# Patient Record
Sex: Male | Born: 1950 | Race: White | Hispanic: No | Marital: Married | Smoking: Current every day smoker
Health system: Southern US, Community
[De-identification: ages and names within clinical notes are randomized; demographics above are authoritative.]

## PROBLEM LIST (undated history)

## (undated) DIAGNOSIS — I2699 Other pulmonary embolism without acute cor pulmonale: Secondary | ICD-10-CM

## (undated) DIAGNOSIS — Z7901 Long term (current) use of anticoagulants: Secondary | ICD-10-CM

## (undated) DIAGNOSIS — Z95828 Presence of other vascular implants and grafts: Secondary | ICD-10-CM

## (undated) DIAGNOSIS — J40 Bronchitis, not specified as acute or chronic: Secondary | ICD-10-CM

## (undated) DIAGNOSIS — I82409 Acute embolism and thrombosis of unspecified deep veins of unspecified lower extremity: Secondary | ICD-10-CM

---

## 2004-09-03 ENCOUNTER — Encounter: Admission: RE | Admit: 2004-09-03 | Discharge: 2004-09-03 | Payer: Self-pay | Admitting: *Deleted

## 2004-09-09 ENCOUNTER — Encounter: Admission: RE | Admit: 2004-09-09 | Discharge: 2004-09-09 | Payer: Self-pay | Admitting: *Deleted

## 2011-01-11 DIAGNOSIS — Z7901 Long term (current) use of anticoagulants: Secondary | ICD-10-CM

## 2011-01-11 DIAGNOSIS — I2699 Other pulmonary embolism without acute cor pulmonale: Secondary | ICD-10-CM

## 2011-01-11 HISTORY — DX: Long term (current) use of anticoagulants: Z79.01

## 2011-01-11 HISTORY — DX: Other pulmonary embolism without acute cor pulmonale: I26.99

## 2011-02-03 ENCOUNTER — Emergency Department (HOSPITAL_COMMUNITY): Payer: BC Managed Care – HMO

## 2011-02-03 ENCOUNTER — Inpatient Hospital Stay (HOSPITAL_COMMUNITY)
Admission: EM | Admit: 2011-02-03 | Discharge: 2011-02-08 | DRG: 539 | Disposition: A | Discharge status: Home / Self Care | Payer: BC Managed Care – HMO | Source: Ambulatory Visit | Attending: Internal Medicine | Admitting: Internal Medicine

## 2011-02-03 ENCOUNTER — Encounter: Payer: Self-pay | Admitting: *Deleted

## 2011-02-03 ENCOUNTER — Other Ambulatory Visit: Payer: Self-pay

## 2011-02-03 DIAGNOSIS — F17200 Nicotine dependence, unspecified, uncomplicated: Secondary | ICD-10-CM | POA: Diagnosis present

## 2011-02-03 DIAGNOSIS — I2699 Other pulmonary embolism without acute cor pulmonale: Principal | ICD-10-CM | POA: Diagnosis present

## 2011-02-03 DIAGNOSIS — I498 Other specified cardiac arrhythmias: Secondary | ICD-10-CM | POA: Diagnosis present

## 2011-02-03 DIAGNOSIS — Z720 Tobacco use: Secondary | ICD-10-CM | POA: Diagnosis present

## 2011-02-03 DIAGNOSIS — I824Y9 Acute embolism and thrombosis of unspecified deep veins of unspecified proximal lower extremity: Secondary | ICD-10-CM | POA: Diagnosis present

## 2011-02-03 DIAGNOSIS — I2789 Other specified pulmonary heart diseases: Secondary | ICD-10-CM | POA: Diagnosis present

## 2011-02-03 DIAGNOSIS — J40 Bronchitis, not specified as acute or chronic: Secondary | ICD-10-CM | POA: Diagnosis present

## 2011-02-03 DIAGNOSIS — I825Y9 Chronic embolism and thrombosis of unspecified deep veins of unspecified proximal lower extremity: Secondary | ICD-10-CM | POA: Diagnosis present

## 2011-02-03 DIAGNOSIS — R0602 Shortness of breath: Secondary | ICD-10-CM

## 2011-02-03 DIAGNOSIS — R7989 Other specified abnormal findings of blood chemistry: Secondary | ICD-10-CM

## 2011-02-03 HISTORY — DX: Acute embolism and thrombosis of unspecified deep veins of unspecified lower extremity: I82.409

## 2011-02-03 HISTORY — DX: Bronchitis, not specified as acute or chronic: J40

## 2011-02-03 HISTORY — DX: Other pulmonary embolism without acute cor pulmonale: I26.99

## 2011-02-03 HISTORY — DX: Long term (current) use of anticoagulants: Z79.01

## 2011-02-03 HISTORY — DX: Presence of other vascular implants and grafts: Z95.828

## 2011-02-03 LAB — CBC
Hemoglobin: 17.9 g/dL — ABNORMAL HIGH (ref 13.0–17.0)
MCH: 30.9 pg (ref 26.0–34.0)
MCHC: 35.2 g/dL (ref 30.0–36.0)
MCV: 87.6 fL (ref 78.0–100.0)
Platelets: 242 10*3/uL (ref 150–400)
RDW: 12.6 % (ref 11.5–15.5)
WBC: 11.8 10*3/uL — ABNORMAL HIGH (ref 4.0–10.5)

## 2011-02-03 LAB — POCT I-STAT TROPONIN I: Troponin i, poc: 0.39 ng/mL (ref 0.00–0.08)

## 2011-02-03 LAB — BASIC METABOLIC PANEL
Creatinine, Ser: 1.12 mg/dL (ref 0.50–1.35)
GFR calc Af Amer: 80 mL/min — ABNORMAL LOW (ref >90)
Glucose, Bld: 113 mg/dL — ABNORMAL HIGH (ref 70–99)

## 2011-02-03 LAB — D-DIMER, QUANTITATIVE: D-Dimer, Quant: 5.1 ug/mL-FEU — ABNORMAL HIGH (ref 0.00–0.48)

## 2011-02-03 MED ORDER — POLYETHYLENE GLYCOL 3350 17 G PO PACK
17.0000 g | PACK | Freq: Every day | ORAL | Status: DC | PRN
  Filled 2011-02-03: qty 1

## 2011-02-03 MED ORDER — ACETAMINOPHEN 650 MG RE SUPP: 650.0000 mg | Freq: Four times a day (QID) | RECTAL | Status: DC | PRN

## 2011-02-03 MED ORDER — OXYCODONE HCL 5 MG PO TABS: 5.0000 mg | ORAL_TABLET | ORAL | Status: DC | PRN

## 2011-02-03 MED ORDER — ONDANSETRON HCL 4 MG PO TABS: 4.0000 mg | ORAL_TABLET | Freq: Four times a day (QID) | ORAL | Status: DC | PRN

## 2011-02-03 MED ORDER — IOHEXOL 350 MG/ML SOLN
100.0000 mL | Freq: Once | INTRAVENOUS | Status: AC | PRN
  Administered 2011-02-03: 100 mL via INTRAVENOUS

## 2011-02-03 MED ORDER — HEPARIN (PORCINE) IN NACL 100-0.45 UNIT/ML-% IJ SOLN
2600.0000 [IU]/h | INTRAMUSCULAR | Status: DC
  Administered 2011-02-03: 1150 [IU]/h via INTRAVENOUS
  Administered 2011-02-04 (×2): 1500 [IU]/h via INTRAVENOUS
  Administered 2011-02-05 (×2): 2250 [IU]/h via INTRAVENOUS
  Administered 2011-02-05: 2000 [IU]/h via INTRAVENOUS
  Administered 2011-02-05: 2250 [IU]/h via INTRAVENOUS
  Administered 2011-02-06: 2600 [IU]/h via INTRAVENOUS
  Filled 2011-02-03 (×10): qty 250

## 2011-02-03 MED ORDER — HEPARIN BOLUS VIA INFUSION
4000.0000 [IU] | Freq: Once | INTRAVENOUS | Status: AC
  Administered 2011-02-03: 4000 [IU] via INTRAVENOUS
  Filled 2011-02-03: qty 4000

## 2011-02-03 MED ORDER — ONDANSETRON HCL 4 MG/2ML IJ SOLN: 4.0000 mg | Freq: Four times a day (QID) | INTRAMUSCULAR | Status: DC | PRN

## 2011-02-03 MED ORDER — ACETAMINOPHEN 325 MG PO TABS: 650.0000 mg | ORAL_TABLET | Freq: Four times a day (QID) | ORAL | Status: DC | PRN

## 2011-02-03 MED ORDER — DOCUSATE SODIUM 100 MG PO CAPS
100.0000 mg | ORAL_CAPSULE | Freq: Two times a day (BID) | ORAL | Status: DC
  Administered 2011-02-03 – 2011-02-07 (×2): 100 mg via ORAL
  Filled 2011-02-03 (×11): qty 1
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
## 2011-02-03 NOTE — ED Provider Notes (Signed)
History     CSN: [id number] Arrival date & time: 02/03/2011  1:16 PM   First MD Initiated Contact with Patient 02/03/11 1327      Chief Complaint  Patient presents with  . Chest Pain    (Consider location/radiation/quality/duration/timing/severity/associated sxs/prior treatment) HPI Comments: Patient with no previous cardiac history, h/o DVT in left leg s/p anticoagulation x 2 years -- presents with 7 days of bronchitis-like symptoms. However the patient states that for the past 4 days he has been very short of breath with any type of exertion which is unusual for him. The patient states that this morning he noted chest pain described as a substernal chest pressure with walking up stairs and walking outside that relieved with rest. He took 325 mg of aspirin this morning. The pain did not radiate or was not associated with diaphoresis or nausea. The patient is a smoker and has a strong family history of CAD including stent placement in a younger brother and a father who had his first heart attack at age 79. No previous formal stress testing done.  Patient is a 60 y.o. male presenting with chest pain. The history is provided by the patient.  Chest Pain The chest pain began 3 - 5 hours ago. Chest pain occurs intermittently. The chest pain is resolved. The pain is associated with exertion. The quality of the pain is described as pressure-like. The pain does not radiate. Primary symptoms include fatigue and shortness of breath. Pertinent negatives for primary symptoms include no fever, no syncope, no cough, no wheezing, no palpitations, no abdominal pain, no nausea, no vomiting and no dizziness.  Pertinent negatives for associated symptoms include no diaphoresis.  His past medical history is significant for DVT.  Pertinent negatives for past medical history include no MI and no PE.  His family medical history is significant for CAD in family and early MI in family.  Procedure history is negative  for cardiac catheterization.     Past Medical History  Diagnosis Date  . DVT (deep venous thrombosis)   . Bronchitis     History reviewed. No pertinent past surgical history.  History reviewed. No pertinent family history.  History  Substance Use Topics  . Smoking status: Former Games developer  . Smokeless tobacco: Not on file  . Alcohol Use: No      Review of Systems  Constitutional: Positive for fatigue. Negative for fever and diaphoresis.  HENT: Negative for neck pain.   Eyes: Negative for redness.  Respiratory: Positive for shortness of breath. Negative for cough and wheezing.   Cardiovascular: Positive for chest pain. Negative for palpitations and syncope.  Gastrointestinal: Negative for nausea, vomiting and abdominal pain.  Genitourinary: Negative for dysuria.  Musculoskeletal: Negative for back pain.  Skin: Negative for rash.  Neurological: Negative for dizziness, syncope and light-headedness.    Allergies  Review of patient's allergies indicates not on file.  Home Medications  No current outpatient prescriptions on file.  BP 141/98  Pulse 104  Temp(Src) 97 F (36.1 C) (Oral)  Resp 20  SpO2 96%  Physical Exam  Nursing note and vitals reviewed. Constitutional: He is oriented to person, place, and time. He appears well-developed and well-nourished.  HENT:  Head: Normocephalic and atraumatic.  Eyes: Conjunctivae are normal. Pupils are equal, round, and reactive to light.  Neck: Normal range of motion. Neck supple. No JVD present.  Cardiovascular: Normal rate, regular rhythm, normal heart sounds and intact distal pulses.  Exam reveals no gallop and no  friction rub.   No murmur heard. Pulmonary/Chest: Effort normal and breath sounds normal. He has no wheezes. He has no rales. He exhibits no tenderness.  Abdominal: Soft. Bowel sounds are normal. There is no tenderness. There is no rebound and no guarding.  Musculoskeletal: He exhibits no edema.  Neurological: He  is alert and oriented to person, place, and time.  Skin: Skin is warm and dry. He is not diaphoretic.  Psychiatric: He has a normal mood and affect.    ED Course  Procedures (including critical care time)  Labs Reviewed  BASIC METABOLIC PANEL - Abnormal; Notable for the following:    Glucose, Bld 113 (*)    GFR calc non Af Amer 69 (*)    GFR calc Af Amer 80 (*)    All other components within normal limits  CBC - Abnormal; Notable for the following:    WBC 11.8 (*)    Hemoglobin 17.9 (*)    All other components within normal limits  D-DIMER, QUANTITATIVE - Abnormal; Notable for the following:    D-Dimer, Quant 5.10 (*)    All other components within normal limits  POCT I-STAT TROPONIN I - Abnormal; Notable for the following:    Troponin i, poc 0.39 (*)    All other components within normal limits  I-STAT TROPONIN I   Dg Chest 2 View  02/03/2011  *RADIOLOGY REPORT*  Clinical Data: Chest pain and shortness of breath for 1 week. Bronchitis.  CHEST - 2 VIEW  Comparison: None.  Findings: Midline trachea.  Normal heart size and mediastinal contours. No pleural effusion or pneumothorax.  Mild reticular nodular/interstitial opacity.  Slightly upper lobe predominant.  IMPRESSION: 1.  No acute cardiopulmonary disease. 2. Slight upper lobe predominant reticular nodular opacity. Favored to represent the sequelae of chronic bronchitis/smoking. In the acute setting, atypical bacterial or viral pneumonia could look similar.  Original Report Authenticated By: Consuello Bossier, M.D.     1. Chest pain     1:42 PM Patient seen and examined.    Date: 02/03/2011  Rate: 101  Rhythm: sinus tachycardia  QRS Axis: normal  Intervals: normal  ST/T Wave abnormalities: nonspecific ST changes  Conduction Disutrbances:none  Narrative Interpretation:   Old EKG Reviewed: none available  3:10 PM X-ray reviewed by radiologist and by myself.  4:34 PM troponin was elevated. Heparin ordered. I spoke with Dr.  Verdis Prime of [facility] cardiology who will see patient in the emergency department. Will hold on chest CT for now.  5:28 PM CT showed bilateral PE, cards has seen. Will admit to medicine.   5:42 PM Teaching service to see and admit. Dr. Rogelia Boga is admitting attending.   MDM  Suspicion for ACS. Also will need to r/o PE.         Carolee Rota, PA 02/03/11 1636  Carolee Rota, PA 02/03/11 1729  Carolee Rota, PA 02/03/11 [phone]

## 2011-02-03 NOTE — ED Notes (Signed)
Patient transported to CT 

## 2011-02-03 NOTE — ED Notes (Signed)
[place], PA notified of abnormal lab test results
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
## 2011-02-03 NOTE — ED Notes (Signed)
Patient transported to X-ray 

## 2011-02-03 NOTE — Progress Notes (Signed)
60 year old male started having exertional dyspnea yesterday and had exertional chest pain with dyspnea today. He had recently completed a course of Avelox for bronchitis, but he was not having any chest tightness or chest pain with his bronchitis. He does not have a previous known history of coronary artery disease. His ECG is nondiagnostic, but his cardiac markers have come back positive. He will need to be admitted to the cardiology service.

## 2011-02-03 NOTE — ED Notes (Signed)
Gave new ECG to Dr. Carter Kitten and Dr. Preston Fleeting after I performed. 1:26 pm JG

## 2011-02-03 NOTE — ED Notes (Signed)
7 days ago. Developed bronchitis. Taking avelox. Sob with walking. When walking outside developed central cp. Resolved with deep breaths. When walking upstairs became sob. No diaphoresis, no n/v. Took 325 mg. Asa this morning.

## 2011-02-03 NOTE — Progress Notes (Signed)
ANTICOAGULATION CONSULT NOTE - Initial Consult  Pharmacy Consult for Heparin Indication: chest pain/ACS  No Known Allergies  Patient Measurements: Height: 5\' 6"  (167.6 cm) Weight: 185 lb (83.915 kg) IBW/kg (Calculated) : 63.8  Adjusted Body Weight: 81kg  Vital Signs: Temp: 97 F (36.1 C) (11/24 1319) Temp src: Oral (11/24 1319) BP: 132/93 mmHg (11/24 1526) Pulse Rate: 101  (11/24 1526)  Labs:  Basename 02/03/11 1419  HGB 17.9*  HCT 50.8  PLT 242  APTT --  LABPROT --  INR --  HEPARINUNFRC --  CREATININE 1.12  CKTOTAL --  CKMB --  TROPONINI --   Estimated Creatinine Clearance: 70.3 ml/min (by C-G formula based on Cr of 1.12).  Medical History: Past Medical History  Diagnosis Date  . DVT (deep venous thrombosis)   . Bronchitis     Medications:  Pending  Assessment: 60 y/o male being treated with abx for bronchitis developed CP with SOB with + cardiac enzymes. Start IV heparin.   Goal of Therapy:  Heparin level 0.3-0.7 units/ml   Plan:  Heparin 4000 unit bolus, infusion 1150 units/hr. Check heparin level in 6-8 hrs and daily.  Misty Stanley Stillinger 02/03/2011,4:46 PM

## 2011-02-03 NOTE — Progress Notes (Signed)
Received patient as a transfer from 3700. VSS. Denies any pain or SOB.

## 2011-02-03 NOTE — Progress Notes (Signed)
CT angio positive for bilateral PEs. Discussed with EDP - medicine will admit. We will obtain 2D echo to eval for R heart strain. Will follow with you.  Dayna Dunn PA-C 02/03/2011, 5:15 PM

## 2011-02-04 DIAGNOSIS — J40 Bronchitis, not specified as acute or chronic: Secondary | ICD-10-CM | POA: Diagnosis present

## 2011-02-04 DIAGNOSIS — Z720 Tobacco use: Secondary | ICD-10-CM | POA: Diagnosis present

## 2011-02-04 LAB — CARDIAC PANEL(CRET KIN+CKTOT+MB+TROPI)
CK, MB: 3.4 ng/mL (ref 0.3–4.0)
CK, MB: 2.8 ng/mL (ref 0.3–4.0)
Relative Index: 3.3 — ABNORMAL HIGH (ref 0.0–2.5)
Relative Index: 3.4 — ABNORMAL HIGH (ref 0.0–2.5)
Relative Index: INVALID (ref 0.0–2.5)
Total CK: 120 U/L (ref 7–232)
Total CK: 100 U/L (ref 7–232)
Total CK: 90 U/L (ref 7–232)
Troponin I: 0.42 ng/mL (ref <0.30)
Troponin I: <0.3 ng/mL (ref <0.30)
Troponin I: <0.3 ng/mL (ref <0.30)

## 2011-02-04 LAB — ANTITHROMBIN III: AntiThromb III Func: 92 % (ref 75–120)

## 2011-02-04 LAB — BASIC METABOLIC PANEL
Calcium: 8.7 mg/dL (ref 8.4–10.5)
Creatinine, Ser: 1.16 mg/dL (ref 0.50–1.35)
GFR calc Af Amer: 77 mL/min — ABNORMAL LOW (ref >90)
Sodium: 139 mEq/L (ref 135–145)

## 2011-02-04 LAB — PROTIME-INR
INR: 0.99 (ref 0.00–1.49)
Prothrombin Time: 13.3 seconds (ref 11.6–15.2)

## 2011-02-04 LAB — HOMOCYSTEINE: Homocysteine: 12.1 umol/L (ref 4.0–15.4)

## 2011-02-04 LAB — CBC
MCHC: 35.2 g/dL (ref 30.0–36.0)
RBC: 5.29 MIL/uL (ref 4.22–5.81)
WBC: 12.1 10*3/uL — ABNORMAL HIGH (ref 4.0–10.5)

## 2011-02-04 MED ORDER — IBUPROFEN 600 MG PO TABS
600.0000 mg | ORAL_TABLET | Freq: Four times a day (QID) | ORAL | Status: DC | PRN
  Filled 2011-02-04: qty 1

## 2011-02-04 MED ORDER — WARFARIN VIDEO
Freq: Once | Status: DC
  Filled 2011-02-04: qty 1

## 2011-02-04 MED ORDER — SODIUM CHLORIDE 0.9 % IV SOLN
INTRAVENOUS | Status: DC
  Administered 2011-02-04 (×2): via INTRAVENOUS

## 2011-02-04 MED ORDER — HEPARIN BOLUS VIA INFUSION
5000.0000 [IU] | Freq: Once | INTRAVENOUS | Status: AC
  Administered 2011-02-04: 5000 [IU] via INTRAVENOUS
  Filled 2011-02-04: qty 5000

## 2011-02-04 MED ORDER — HEPARIN BOLUS VIA INFUSION
3000.0000 [IU] | Freq: Once | INTRAVENOUS | Status: AC
  Administered 2011-02-04: 3000 [IU] via INTRAVENOUS
  Filled 2011-02-04: qty 3000

## 2011-02-04 MED ORDER — PATIENT'S GUIDE TO USING COUMADIN BOOK
Freq: Once | Status: AC
  Administered 2011-02-04: 18:00:00
  Filled 2011-02-04: qty 1

## 2011-02-04 MED ORDER — WARFARIN SODIUM 10 MG PO TABS
10.0000 mg | ORAL_TABLET | Freq: Once | ORAL | Status: AC
  Administered 2011-02-04: 10 mg via ORAL
  Filled 2011-02-04: qty 1

## 2011-02-04 NOTE — H&P (Signed)
Internal Medicine Teaching Service Attending Note Date: 02/04/2011  Patient name: Austin Garza  Medical record number: [id number]  Date of birth: [birth year]   I have seen and evaluated Austin Garza and discussed their care with the Residency Team.   Patient is comfortable lying in bed but with any exertion is SOB and desaturates to 02 sats in the 80's. He does not seem to want to admit the severity of his illness or the importance of getting a PCP or taking better care of himself.  Physical Exam: Blood pressure 124/67, pulse 71, temperature 98.4 F (36.9 C), temperature source Oral, resp. rate 17, height 5' 6.5" (1.689 m), weight 188 lb 11.4 oz (85.6 kg), SpO2 96.00%. Sitting up in bed in NAD,  Neck-JVD, lungs- clear to my exam, heart-RRR, abdomen-+BS, NT, extrem-no edema, neuro-non-focal.  Lab results: Results for orders placed during the hospital encounter of 02/03/11 (from the past 24 hour(s))  BASIC METABOLIC PANEL     Status: Abnormal   Collection Time   02/03/11  2:19 PM      Component Value Range   Sodium 137  135 - 145 (mEq/L)   Potassium 4.1  3.5 - 5.1 (mEq/L)   Chloride 102  96 - 112 (mEq/L)   CO2 24  19 - 32 (mEq/L)   Glucose, Bld 113 (*) 70 - 99 (mg/dL)   BUN 10  6 - 23 (mg/dL)   Creatinine, Ser 4.09  0.50 - 1.35 (mg/dL)   Calcium 9.4  8.4 - 81.1 (mg/dL)   GFR calc non Af Amer 69 (*) >90 (mL/min)   GFR calc Af Amer 80 (*) >90 (mL/min)  CBC     Status: Abnormal   Collection Time   02/03/11  2:19 PM      Component Value Range   WBC 11.8 (*) 4.0 - 10.5 (K/uL)   RBC 5.80  4.22 - 5.81 (MIL/uL)   Hemoglobin 17.9 (*) 13.0 - 17.0 (g/dL)   HCT 91.4  78.2 - 95.6 (%)   MCV 87.6  78.0 - 100.0 (fL)   MCH 30.9  26.0 - 34.0 (pg)   MCHC 35.2  30.0 - 36.0 (g/dL)   RDW 21.3  08.6 - 57.8 (%)   Platelets 242  150 - 400 (K/uL)  D-DIMER, QUANTITATIVE     Status: Abnormal   Collection Time   02/03/11  2:19 PM      Component Value Range   D-Dimer, Quant 5.10 (*) 0.00 - 0.48  (ug/mL-FEU)  POCT I-STAT TROPONIN I     Status: Abnormal   Collection Time   02/03/11  4:04 PM      Component Value Range   Troponin i, poc 0.39 (*) 0.00 - 0.08 (ng/mL)   Comment NOTIFIED PHYSICIAN     Comment 3           MRSA PCR SCREENING     Status: Normal   Collection Time   02/03/11  8:54 PM      Component Value Range   MRSA by PCR NEGATIVE  NEGATIVE   CARDIAC PANEL(CRET KIN+CKTOT+MB+TROPI)     Status: Abnormal   Collection Time   02/04/11 12:01 AM      Component Value Range   Total CK 120  7 - 232 (U/L)   CK, MB 3.9  0.3 - 4.0 (ng/mL)   Troponin I 0.42 (*) <0.30 (ng/mL)   Relative Index 3.3 (*) 0.0 - 2.5   HEPARIN LEVEL (UNFRACTIONATED)     Status: Abnormal  Collection Time   02/04/11 12:23 AM      Component Value Range   Heparin Unfractionated <0.10 (*) 0.30 - 0.70 (IU/mL)  ANTITHROMBIN III     Status: Normal   Collection Time   02/04/11 12:23 AM      Component Value Range   AntiThromb III Func 92  75 - 120 (%)  PRO B NATRIURETIC PEPTIDE     Status: Abnormal   Collection Time   02/04/11 12:45 AM      Component Value Range   BNP, POC 723.3 (*) 0 - 125 (pg/mL)  CBC     Status: Abnormal   Collection Time   02/04/11  5:00 AM      Component Value Range   WBC 12.1 (*) 4.0 - 10.5 (K/uL)   RBC 5.29  4.22 - 5.81 (MIL/uL)   Hemoglobin 16.4  13.0 - 17.0 (g/dL)   HCT 16.1  09.6 - 04.5 (%)   MCV 88.1  78.0 - 100.0 (fL)   MCH 31.0  26.0 - 34.0 (pg)   MCHC 35.2  30.0 - 36.0 (g/dL)   RDW 40.9  81.1 - 91.4 (%)   Platelets 217  150 - 400 (K/uL)  BASIC METABOLIC PANEL     Status: Abnormal   Collection Time   02/04/11  5:00 AM      Component Value Range   Sodium 139  135 - 145 (mEq/L)   Potassium 4.8  3.5 - 5.1 (mEq/L)   Chloride 104  96 - 112 (mEq/L)   CO2 27  19 - 32 (mEq/L)   Glucose, Bld 115 (*) 70 - 99 (mg/dL)   BUN 11  6 - 23 (mg/dL)   Creatinine, Ser 7.82  0.50 - 1.35 (mg/dL)   Calcium 8.7  8.4 - 95.6 (mg/dL)   GFR calc non Af Amer 66 (*) >90 (mL/min)   GFR  calc Af Amer 77 (*) >90 (mL/min)  PROTIME-INR     Status: Normal   Collection Time   02/04/11  5:00 AM      Component Value Range   Prothrombin Time 13.3  11.6 - 15.2 (seconds)   INR 0.99  0.00 - 1.49     Imaging results:  Dg Chest 2 View  02/03/2011  *RADIOLOGY REPORT*  Clinical Data: Chest pain and shortness of breath for 1 week. Bronchitis.  CHEST - 2 VIEW  Comparison: None.  Findings: Midline trachea.  Normal heart size and mediastinal contours. No pleural effusion or pneumothorax.  Mild reticular nodular/interstitial opacity.  Slightly upper lobe predominant.  IMPRESSION: 1.  No acute cardiopulmonary disease. 2. Slight upper lobe predominant reticular nodular opacity. Favored to represent the sequelae of chronic bronchitis/smoking. In the acute setting, atypical bacterial or viral pneumonia could look similar.  Original Report Authenticated By: Consuello Bossier, M.D.   Ct Angio Chest W/cm &/or Wo Cm  02/03/2011  *RADIOLOGY REPORT*  Clinical Data:  60 year old male with shortness of breath, chest pain and elevated D-dimer.  CT ANGIOGRAPHY CHEST WITH CONTRAST  Technique:  Multidetector CT imaging of the chest was performed using the standard protocol during bolus administration of intravenous contrast.  Multiplanar CT image reconstructions including MIPs were obtained to evaluate the vascular anatomy.  Contrast: OMNIPAQUE IOHEXOL 350 MG/ML IV SOLN  Comparison:  02/03/2011 chest radiograph  Findings:  This is a technically satisfactory study.  Pulmonary emboli are identified within the distal main pulmonary arteries bilaterally extending into the right middle lobe, lingula, bilateral lower lobes and  right upper lobe.   A moderate to large clot burden is identified with evidence of right heart strain.  There are no pleural or pericardial effusions present. No enlarged lymph nodes are identified.  The lungs are clear. There is no evidence of airspace disease, consolidation, infarct, mass or  endobronchial/endotracheal lesions.  No acute or suspicious bony abnormalities are identified. Fatty infiltration of the visualized liver is identified.  Review of the MIP images confirms the above findings.  IMPRESSION: Bilateral pulmonary emboli with moderate to large clot burden and evidence of right heart strain.  Fatty infiltration of the liver.  Critical Value/emergent results were called by telephone at the time of interpretation on 02/03/2011  at 5:30 p.m.  to  Renne Crigler, who verbally acknowledged these results.  Original Report Authenticated By: Rosendo Gros, M.D.    Assessment and Plan: I agree with the formulated Assessment and Plan with the following changes:  1. Multiple bilateral pulmonary emboli with evidence of right heart strain. I agree with assessment and plan as per team. Again, I tried to speak with patient about severity of his illness but he seems to take it all very lightly and it does not seem like he plans to change any of his patterns. I agree with rest of plans as well.

## 2011-02-04 NOTE — Progress Notes (Signed)
Subjective: No events o/n. Denies SOB, hemoptysis, chest pain, leg swelling or other complaint. States only feels DOE when ambulating  Objective: Vital signs in last 24 hours: Filed Vitals:   02/04/11 0200 02/04/11 0300 02/04/11 0400 02/04/11 0500  BP:   124/67   Pulse: 72 73 69 71  Temp:   98.4 F (36.9 C)   TempSrc:   Oral   Resp: 19 17 20 17   Height:      Weight:      SpO2: 94% 95% 97% 96%   Weight change:   Intake/Output Summary (Last 24 hours) at 02/04/11 0841 Last data filed at 02/04/11 0600  Gross per 24 hour  Intake    559 ml  Output    400 ml  Net    159 ml   Physical Exam:  General: resting in bed Cardiac: RRR, no rubs, murmurs or gallops, No JVD appreciated Pulm: clear to auscultation bilaterally, moving normal volumes of air Abd: soft, nontender, nondistended, BS present Ext: warm and well perfused, no pedal edema, no redness, pain on palpation corded popliteal vessels or other sign of DVT in the leg.  Neuro: alert and oriented X3, cranial nerves II-XII grossly intact, strength and sensation to light touch equal in bilateral upper and lower extremities  Lab Results: Basic Metabolic Panel:  Lab 02/04/11 5284 02/03/11 1419  NA 139 137  K 4.8 4.1  CL 104 102  CO2 27 24  GLUCOSE 115* 113*  BUN 11 10  CREATININE 1.16 1.12  CALCIUM 8.7 9.4  MG -- --  PHOS -- --   CBC:  Lab 02/04/11 0500 02/03/11 1419  WBC 12.1* 11.8*  NEUTROABS -- --  HGB 16.4 17.9*  HCT 46.6 50.8  MCV 88.1 87.6  PLT 217 242   Cardiac Enzymes:  Lab 02/04/11 0001  CKTOTAL 120  CKMB 3.9  CKMBINDEX --  TROPONINI 0.42*   BNP:  Lab 02/04/11 0045  POCBNP 723.3*   D-Dimer:  Lab 02/03/11 1419  DDIMER 5.10*   Coagulation:  Lab 02/04/11 0500  LABPROT 13.3  INR 0.99     Micro Results: Recent Results (from the past 240 hour(s))  MRSA PCR SCREENING     Status: Normal   Collection Time   02/03/11  8:54 PM      Component Value Range Status Comment   MRSA by PCR  NEGATIVE  NEGATIVE  Final    Studies/Results: Dg Chest 2 View  02/03/2011  *RADIOLOGY REPORT*  Clinical Data: Chest pain and shortness of breath for 1 week. Bronchitis.  CHEST - 2 VIEW  Comparison: None.  Findings: Midline trachea.  Normal heart size and mediastinal contours. No pleural effusion or pneumothorax.  Mild reticular nodular/interstitial opacity.  Slightly upper lobe predominant.  IMPRESSION: 1.  No acute cardiopulmonary disease. 2. Slight upper lobe predominant reticular nodular opacity. Favored to represent the sequelae of chronic bronchitis/smoking. In the acute setting, atypical bacterial or viral pneumonia could look similar.  Original Report Authenticated By: Consuello Bossier, M.D.   Ct Angio Chest W/cm &/or Wo Cm  02/03/2011  *RADIOLOGY REPORT*  Clinical Data:  60 year old male with shortness of breath, chest pain and elevated D-dimer.  CT ANGIOGRAPHY CHEST WITH CONTRAST  Technique:  Multidetector CT imaging of the chest was performed using the standard protocol during bolus administration of intravenous contrast.  Multiplanar CT image reconstructions including MIPs were obtained to evaluate the vascular anatomy.  Contrast: OMNIPAQUE IOHEXOL 350 MG/ML IV SOLN  Comparison:  02/03/2011  chest radiograph  Findings:  This is a technically satisfactory study.  Pulmonary emboli are identified within the distal main pulmonary arteries bilaterally extending into the right middle lobe, lingula, bilateral lower lobes and right upper lobe.   A moderate to large clot burden is identified with evidence of right heart strain.  There are no pleural or pericardial effusions present. No enlarged lymph nodes are identified.  The lungs are clear. There is no evidence of airspace disease, consolidation, infarct, mass or endobronchial/endotracheal lesions.  No acute or suspicious bony abnormalities are identified. Fatty infiltration of the visualized liver is identified.  Review of the MIP images confirms  the above findings.  IMPRESSION: Bilateral pulmonary emboli with moderate to large clot burden and evidence of right heart strain.  Fatty infiltration of the liver.  Critical Value/emergent results were called by telephone at the time of interpretation on 02/03/2011  at 5:30 p.m.  to  Renne Crigler, who verbally acknowledged these results.  Original Report Authenticated By: Rosendo Gros, M.D.     EKG today shows S1 Q3 T3 consistent with pulmonary embolism. This is much more pronounced from yesterday's EKG and lead 3 t wave inversion is new.    Medications: I have reviewed the patient's current medications. Scheduled Meds:    . docusate sodium  100 mg Oral BID  . heparin  3,000 Units Intravenous Once  . heparin  4,000 Units Intravenous Once   Continuous Infusions:    . sodium chloride 10 mL/hr at 02/04/11 0227  . heparin 1,500 Units/hr (02/04/11 0839)   PRN Meds:.acetaminophen, acetaminophen, iohexol, ondansetron (ZOFRAN) IV, ondansetron, oxyCODONE, polyethylene glycol Assessment/Plan: Principal Problem: 1) Pulmonary embolism, bilateral - EKG and CTA classic for large PE with right heart strain.Concern is that moderate to large clot burden could potentially lead to hemodynamic compromise. Will immobilize and get LE dopplars to prevent further clot burden. Overall will probably need lifelong warfarin without significant change in lifestyle.  Thrombophilia panel started after heparin, but will follow for facter 5 leiden, and prothrombin mut. -- 2D echo to eval for R heart function -- heparin per pharm -- warfarin per pharm -- bedrest for 2 days -- CBC and BMP in AM for plts, Hb and Cr.  Active Problems: 2) Bronchitis - I can hear nothing on exam. Cough is likely irritation 2/2 PE rather than continued bronchitis. Moxifloxacin course was 8 days and has been afebrile since admision -- ibuprophen PRN  3) Tobacco abuse - Mr. Sessler absolutely needs to discontinue smoking. This was  reiterated to him today.   4) DVT proph - Heparin per pharm with warfarin to follow   LOS: 1 day   Angenette Daily 02/04/2011, 8:41 AM

## 2011-02-04 NOTE — Progress Notes (Signed)
ANTICOAGULATION CONSULT NOTE - Follow Up Consult  Pharmacy Consult for Heparin and Coumadin Indication: pulmonary embolus - large clot burden  No Known Allergies  Patient Measurements: Height: 5' 6.5" (168.9 cm) Weight: 188 lb 11.4 oz (85.6 kg) IBW/kg (Calculated) : 64.95  Adjusted Body Weight: 83 kg  Vital Signs: Temp: 98.1 F (36.7 C) (11/25 0800) Temp src: Oral (11/25 0800) BP: 124/67 mmHg (11/25 0400) Pulse Rate: 71  (11/25 0500)  Labs:  Basename 02/04/11 0916 02/04/11 0500 02/04/11 0023 02/04/11 0001 02/03/11 1419  HGB -- 16.4 -- -- 17.9*  HCT -- 46.6 -- -- 50.8  PLT -- 217 -- -- 242  APTT -- -- -- -- --  LABPROT -- 13.3 -- -- --  INR -- 0.99 -- -- --  HEPARINUNFRC <0.10* -- <0.10* -- --  CREATININE -- 1.16 -- -- 1.12  CKTOTAL 100 -- -- 120 --  CKMB 3.4 -- -- 3.9 --  TROPONINI <0.30 -- -- 0.42* --   Estimated Creatinine Clearance: 69.2 ml/min (by C-G formula based on Cr of 1.16).   Assessment:     Heparin level is still undetectable after Heparin 3000 unit bolus and increasing rate to 1500 units/hr overnight. No IV infusion problems noted.   Noted large clot burden. To begin Coumadin today.  Overlap day #1 of 5 minimum.  Goal of Therapy:  Heparin level 0.3-0.7 units/ml INR 2-3  Plan:     Will re-bolus with 5000 units IV Heparin (~60 units/kg).    Increase Heparin drip to 1900 units/hr.    Next level in 6 hrs.    Coumadin 10 mg PO tonight.    Education materials will be provided.    Daily PT/INR to begin in AM.  Dennie Fetters Pager: [phone] 02/04/2011,11:27 AM

## 2011-02-04 NOTE — Progress Notes (Signed)
Patient Name: Austin Garza Date of Encounter: 02/04/2011    SUBJECTIVE: Loquacious gentleman with no specific complaints this morning. He denies chest pain. He will see get up and ambulate. No chest pain. No hemoptysis. No palpitations.  TELEMETRY:  Sinus rhythm with sinus tachycardia intermittently.: Filed Vitals:   02/04/11 0200 02/04/11 0300 02/04/11 0400 02/04/11 0500  BP:   124/67   Pulse: 72 73 69 71  Temp:   98.4 F (36.9 C)   TempSrc:   Oral   Resp: 19 17 20 17   Height:      Weight:      SpO2: 94% 95% 97% 96%    Intake/Output Summary (Last 24 hours) at 02/04/11 0827 Last data filed at 02/04/11 0600  Gross per 24 hour  Intake    559 ml  Output    400 ml  Net    159 ml    LABS: Basic Metabolic Panel:  Basename 02/04/11 0500 02/03/11 1419  NA 139 137  K 4.8 4.1  CL 104 102  CO2 27 24  GLUCOSE 115* 113*  BUN 11 10  CREATININE 1.16 1.12  CALCIUM 8.7 9.4  MG -- --  PHOS -- --   CBC:  Basename 02/04/11 0500 02/03/11 1419  WBC 12.1* 11.8*  NEUTROABS -- --  HGB 16.4 17.9*  HCT 46.6 50.8  MCV 88.1 87.6  PLT 217 242   Cardiac Enzymes:  Basename 02/04/11 0001  CKTOTAL 120  CKMB 3.9  CKMBINDEX --  TROPONINI 0.42*   BNP:  Basename 02/04/11 0045  POCBNP 723.3*   Hemoglobin A1C: No results found for this basename: HGBA1C in the last 72 hours Fasting Lipid Panel: No results found for this basename: CHOL,HDL,LDLCALC,TRIG,CHOLHDL,LDLDIRECT in the last 72 hours  Radiology/Studies:  CT scan reviewed. Large bilateral pulmonary clot burden.  EKG: Sinus tach with S1Q 3T3 and RSR prime in V1 consistent with pulmonary embolism and right heart strain  Physical Exam: Blood pressure 124/67, pulse 71, temperature 98.4 F (36.9 C), temperature source Oral, resp. rate 17, height 5' 6.5" (1.689 m), weight 85.6 kg (188 lb 11.4 oz), SpO2 96.00%. Weight change:    Split S2. Increased P2 component. No murmur. No RV heave is palpable. I am unable to discern the  JVD due to the patient's neck anatomy. The abdomen is soft. The extremities reveal no edema.  ASSESSMENT:  1. Massive bilateral pulmonary emboli with pulmonary hypertension  2. History of DVT.  3. Heavy tobacco use and probable COPD.  Plan:  1. Relative immobility for an additional 24-48 hours to minimize the risk of further embolization.  2. 2-D echocardiogram should be done to assess for pulmonary hypertension and RV size.  [name] 02/04/2011, 8:27 AM

## 2011-02-04 NOTE — Progress Notes (Signed)
CRITICAL CARE Performed by: Dione Booze   Total critical care time: 35 minutes  Critical care time was exclusive of separately billable procedures and treating other patients.  Critical care was necessary to treat or prevent imminent or life-threatening deterioration.  Critical care was time spent personally by me on the following activities: development of treatment plan with patient and/or surrogate as well as nursing, discussions with consultants, evaluation of patient's response to treatment, examination of patient, obtaining history from patient or surrogate, ordering and performing treatments and interventions, ordering and review of laboratory studies, ordering and review of radiographic studies, pulse oximetry and re-evaluation of patient's condition.

## 2011-02-04 NOTE — Progress Notes (Signed)
ANTICOAGULATION CONSULT NOTE - Follow Up Consult  Pharmacy Consult for heparin Indication: pulmonary embolus  No Known Allergies  Patient Measurements: Height: 5' 6.5" (168.9 cm) Weight: 188 lb 11.4 oz (85.6 kg) IBW/kg (Calculated) : 64.95  Adjusted Body Weight: 82.6 kg  Vital Signs: Temp: 99.5 F (37.5 C) (11/24 2300) Temp src: Oral (11/24 2300) BP: 126/67 mmHg (11/24 2300) Pulse Rate: 86  (11/25 0100)  Labs:  Basename 02/04/11 0023 02/04/11 0001 02/03/11 1419  HGB -- -- 17.9*  HCT -- -- 50.8  PLT -- -- 242  APTT -- -- --  LABPROT -- -- --  INR -- -- --  HEPARINUNFRC <0.10* -- --  CREATININE -- -- 1.12  CKTOTAL -- 120 --  CKMB -- 3.9 --  TROPONINI -- 0.42* --   Estimated Creatinine Clearance: 71.7 ml/min (by C-G formula based on Cr of 1.12).  Medications:  Scheduled:    . docusate sodium  100 mg Oral BID  . heparin  3,000 Units Intravenous Once  . heparin  4,000 Units Intravenous Once   Infusions:    . sodium chloride    . heparin 1,150 Units/hr (02/03/11 1736)    Assessment: 60yo male undetectable on heparin with initial dosing for what was assumed ACS, now + bilateral PE.  Goal of Therapy:  Heparin level 0.3-0.7 units/ml   Plan:  Will give bolus of 3000 units and increase gtt by 4 units/kg/hr to 1500 units/hr and check level in 6hr.  Colleen Can PharmD BCPS 02/04/2011,2:12 AM

## 2011-02-04 NOTE — Progress Notes (Signed)
ANTICOAGULATION CONSULT NOTE - Follow Up Consult  Pharmacy Consult for Heparin Indication: pulmonary embolus - large clot burden  No Known Allergies  Patient Measurements: Height: 5' 6.5" (168.9 cm) Weight: 188 lb 11.4 oz (85.6 kg) IBW/kg (Calculated) : 64.95  Adjusted Body Weight: 83 kg  Vital Signs: Temp: 98.5 F (36.9 C) (11/25 1600) Temp src: Oral (11/25 1600) BP: 117/60 mmHg (11/25 1200) Pulse Rate: 71  (11/25 1200)  Labs:  Basename 02/04/11 1703 02/04/11 0916 02/04/11 0500 02/04/11 0023 02/04/11 0001 02/03/11 1419  HGB -- -- 16.4 -- -- 17.9*  HCT -- -- 46.6 -- -- 50.8  PLT -- -- 217 -- -- 242  APTT -- -- -- -- -- --  LABPROT -- -- 13.3 -- -- --  INR -- -- 0.99 -- -- --  HEPARINUNFRC 0.32 <0.10* -- <0.10* -- --  CREATININE -- -- 1.16 -- -- 1.12  CKTOTAL 90 100 -- -- 120 --  CKMB 2.8 3.4 -- -- 3.9 --  TROPONINI <0.30 <0.30 -- -- 0.42* --   Estimated Creatinine Clearance: 69.2 ml/min (by C-G formula based on Cr of 1.16).   Assessment:  PE, large clot burden: Heparin level is therapeutic after adjustment.  Goal of Therapy:  Heparin level 0.3-0.7 units/ml  Plan:  Continue Heparin at 1900 units/hr Recheck Heparin level at midnight to confirm.  Estella Husk, Pharm.D., BCPS Clinical Pharmacist  Pager [phone] 02/04/2011, 6:39 PM

## 2011-02-04 NOTE — ED Provider Notes (Signed)
I have personally performed and participated in all the services and procedures documented herein. I have reviewed the findings with the patient.   Dione Booze, MD 02/04/11 [phone]

## 2011-02-05 DIAGNOSIS — I2699 Other pulmonary embolism without acute cor pulmonale: Secondary | ICD-10-CM

## 2011-02-05 DIAGNOSIS — M79609 Pain in unspecified limb: Secondary | ICD-10-CM

## 2011-02-05 DIAGNOSIS — M7989 Other specified soft tissue disorders: Secondary | ICD-10-CM

## 2011-02-05 DIAGNOSIS — R079 Chest pain, unspecified: Secondary | ICD-10-CM

## 2011-02-05 DIAGNOSIS — I517 Cardiomegaly: Secondary | ICD-10-CM

## 2011-02-05 LAB — PROTIME-INR
INR: 1.03 (ref 0.00–1.49)
Prothrombin Time: 13.7 seconds (ref 11.6–15.2)

## 2011-02-05 LAB — BASIC METABOLIC PANEL
BUN: 12 mg/dL (ref 6–23)
CO2: 25 mEq/L (ref 19–32)
Creatinine, Ser: 1.13 mg/dL (ref 0.50–1.35)
GFR calc non Af Amer: 68 mL/min — ABNORMAL LOW (ref >90)
Glucose, Bld: 113 mg/dL — ABNORMAL HIGH (ref 70–99)
Potassium: 3.8 mEq/L (ref 3.5–5.1)

## 2011-02-05 LAB — CARDIOLIPIN ANTIBODIES, IGG, IGM, IGA
Anticardiolipin IgA: 11 APL U/mL — ABNORMAL LOW (ref <22)
Anticardiolipin IgG: 6 GPL U/mL — ABNORMAL LOW (ref <23)
Anticardiolipin IgM: 1 MPL U/mL — ABNORMAL LOW (ref <11)

## 2011-02-05 LAB — HEPARIN LEVEL (UNFRACTIONATED): Heparin Unfractionated: 0.46 IU/mL (ref 0.30–0.70)

## 2011-02-05 LAB — BETA-2-GLYCOPROTEIN I ABS, IGG/M/A: Beta-2-Glycoprotein I IgM: 1 M Units (ref <20)

## 2011-02-05 LAB — CBC
HCT: 44.6 % (ref 39.0–52.0)
Hemoglobin: 15.3 g/dL (ref 13.0–17.0)
MCV: 87.6 fL (ref 78.0–100.0)
RDW: 12.7 % (ref 11.5–15.5)
WBC: 13.5 10*3/uL — ABNORMAL HIGH (ref 4.0–10.5)

## 2011-02-05 LAB — LUPUS ANTICOAGULANT PANEL
DRVVT: 45.4 secs — ABNORMAL HIGH (ref 34.1–42.2)
Lupus Anticoagulant: NOT DETECTED
PTTLA 4:1 Mix: 42.8 secs (ref 28.0–43.0)
dRVVT Incubated 1:1 Mix: 37.6 secs (ref 34.1–42.2)

## 2011-02-05 MED ORDER — WARFARIN SODIUM 10 MG PO TABS
10.0000 mg | ORAL_TABLET | Freq: Once | ORAL | Status: AC
  Administered 2011-02-05: 10 mg via ORAL
  Filled 2011-02-05: qty 1

## 2011-02-05 NOTE — Progress Notes (Signed)
Subjective:   60 yo gentlemen admitted with bilateral PE with big clot burden, now on heparin/coumadin.   Feels fine. No CP or SOB. No bleeding.      Intake/Output Summary (Last 24 hours) at 02/05/11 0801 Last data filed at 02/05/11 0704  Gross per 24 hour  Intake 1341.99 ml  Output   1585 ml  Net -243.01 ml    Current meds:    . docusate sodium  100 mg Oral BID  . heparin  5,000 Units Intravenous Once  . patient's guide to using coumadin book   Does not apply Once  . warfarin  10 mg Oral ONCE-1800  . warfarin   Does not apply Once   Infusions:    . sodium chloride 10 mL/hr at 02/04/11 2000  . heparin 2,250 Units/hr (02/05/11 0704)     Objective:  Blood pressure 129/69, pulse 62, temperature 98.6 F (37 C), temperature source Oral, resp. rate 18, height 5' 6.5" (1.689 m), weight 85.6 kg (188 lb 11.4 oz), SpO2 95.00%. Weight change:   Physical Exam: General:  Well appearing. No resp difficulty HEENT: normal Neck: supple. JVP flat . Carotids 2+ bilat; no bruits. No lymphadenopathy or thryomegaly appreciated. Cor: PMI nondisplaced. Regular rate & rhythm. No rubs, gallops or murmurs. No RV lift Lungs: clear Abdomen: soft, nontender, nondistended. No hepatosplenomegaly. No bruits or masses. Good bowel sounds. Extremities: no cyanosis, clubbing, rash, edema Neuro: alert & orientedx3, cranial nerves grossly intact. moves all 4 extremities w/o difficulty. Affect pleasant  Telemetry: Sinus rhythm 50-70s  Lab Results: Basic Metabolic Panel:  Lab 02/05/11 6295 02/04/11 0500 02/03/11 1419  NA 138 139 137  K 3.8 4.8 --  CL 104 104 102  CO2 25 27 24   GLUCOSE 113* 115* 113*  BUN 12 11 10   CREATININE 1.13 1.16 1.12  CALCIUM 8.7 8.7 9.4  MG -- -- --  PHOS -- -- --   Liver Function Tests: No results found for this basename: AST:5,ALT:5,ALKPHOS:5,BILITOT:5,PROT:5,ALBUMIN:5 in the last 168 hours No results found for this basename: LIPASE:5,AMYLASE:5 in the last 168  hours No results found for this basename: AMMONIA:5 in the last 168 hours CBC:  Lab 02/05/11 0455 02/04/11 0500 02/03/11 1419  WBC 13.5* 12.1* 11.8*  NEUTROABS -- -- --  HGB 15.3 16.4 17.9*  HCT 44.6 46.6 50.8  MCV 87.6 88.1 87.6  PLT 240 217 242   Cardiac Enzymes:  Lab 02/04/11 1703 02/04/11 0916 02/04/11 0001  CKTOTAL 90 100 120  CKMB 2.8 3.4 3.9  CKMBINDEX -- -- --  TROPONINI <0.30 <0.30 0.42*   BNP:  Lab 02/04/11 0045  POCBNP 723.3*   CBG: No results found for this basename: GLUCAP:5 in the last 168 hours Microbiology: No results found for this basename: cult   No results found for this basename: CULT:2,SDES:2 in the last 168 hours  Imaging: Dg Chest 2 View  02/03/2011  *RADIOLOGY REPORT*  Clinical Data: Chest pain and shortness of breath for 1 week. Bronchitis.  CHEST - 2 VIEW  Comparison: None.  Findings: Midline trachea.  Normal heart size and mediastinal contours. No pleural effusion or pneumothorax.  Mild reticular nodular/interstitial opacity.  Slightly upper lobe predominant.  IMPRESSION: 1.  No acute cardiopulmonary disease. 2. Slight upper lobe predominant reticular nodular opacity. Favored to represent the sequelae of chronic bronchitis/smoking. In the acute setting, atypical bacterial or viral pneumonia could look similar.  Original Report Authenticated By: Consuello Bossier, M.D.   Ct Angio Chest W/cm &/or Wo Cm  02/03/2011  *RADIOLOGY REPORT*  Clinical Data:  60 year old male with shortness of breath, chest pain and elevated D-dimer.  CT ANGIOGRAPHY CHEST WITH CONTRAST  Technique:  Multidetector CT imaging of the chest was performed using the standard protocol during bolus administration of intravenous contrast.  Multiplanar CT image reconstructions including MIPs were obtained to evaluate the vascular anatomy.  Contrast: OMNIPAQUE IOHEXOL 350 MG/ML IV SOLN  Comparison:  02/03/2011 chest radiograph  Findings:  This is a technically satisfactory study.   Pulmonary emboli are identified within the distal main pulmonary arteries bilaterally extending into the right middle lobe, lingula, bilateral lower lobes and right upper lobe.   A moderate to large clot burden is identified with evidence of right heart strain.  There are no pleural or pericardial effusions present. No enlarged lymph nodes are identified.  The lungs are clear. There is no evidence of airspace disease, consolidation, infarct, mass or endobronchial/endotracheal lesions.  No acute or suspicious bony abnormalities are identified. Fatty infiltration of the visualized liver is identified.  Review of the MIP images confirms the above findings.  IMPRESSION: Bilateral pulmonary emboli with moderate to large clot burden and evidence of right heart strain.  Fatty infiltration of the liver.  Critical Value/emergent results were called by telephone at the time of interpretation on 02/03/2011  at 5:30 p.m.  to  Renne Crigler, who verbally acknowledged these results.  Original Report Authenticated By: Rosendo Gros, M.D.     ASSESSMENT:  1) Large bilateral PE 2) DVT 30 COPD with ongoing tobacco use  PLAN/DISCUSSION:  Tolerating large clot burden remarkably well. No evidence of RV strain clinically. Continue heparin/coumadin with at least 24 hour overlap. Await echo results today though will likely not change our management at this point. Will need lifelong coumadin.    LOS: 2 days    Robbi Garter, PA 02/05/2011, 8:01 AM

## 2011-02-05 NOTE — Progress Notes (Signed)
ANTICOAGULATION CONSULT NOTE - Follow Up Consult  Pharmacy Consult for Heparin/coumadin Indication: pulmonary embolus - large clot burden  No Known Allergies  Patient Measurements: Height: 5' 6.5" (168.9 cm) Weight: 188 lb 11.4 oz (85.6 kg) IBW/kg (Calculated) : 64.95  Adjusted Body Weight: 83 kg  Vital Signs: Temp: 98.2 F (36.8 C) (11/26 1146) Temp src: Oral (11/26 1146) BP: 129/77 mmHg (11/26 1146) Pulse Rate: 68  (11/26 1146)  Labs:  Basename 02/05/11 1307 02/05/11 0455 02/05/11 0001 02/04/11 1703 02/04/11 0916 02/04/11 0500 02/04/11 0001 02/03/11 1419  HGB -- 15.3 -- -- -- 16.4 -- --  HCT -- 44.6 -- -- -- 46.6 -- 50.8  PLT -- 240 -- -- -- 217 -- 242  APTT -- -- -- -- -- -- -- --  LABPROT 13.7 -- -- -- -- 13.3 -- --  INR 1.03 -- -- -- -- 0.99 -- --  HEPARINUNFRC 0.46 0.24* 0.31 -- -- -- -- --  CREATININE -- 1.13 -- -- -- 1.16 -- 1.12  CKTOTAL -- -- -- 90 100 -- 120 --  CKMB -- -- -- 2.8 3.4 -- 3.9 --  TROPONINI -- -- -- <0.30 <0.30 -- 0.42* --   Estimated Creatinine Clearance: 71.1 ml/min (by C-G formula based on Cr of 1.13).   Assessment: 60 yo male with PE on heparin/coumadin. Heparin level at goal  Goal of Therapy:  Heparin level 0.3-0.7 units/ml INR=2-3  Plan:  -Continue Heparin at 2250 units/hr -Coumadin 10mg  for one more day then will consider home regimen.  Harland German, Pharm D 02/05/2011 2:25 PM    02/05/2011, 2:21 PM

## 2011-02-05 NOTE — Progress Notes (Signed)
Lower venous dopplers preformed. Results showed acute DVT noted in the right mid to distal femoral vein and popliteal vein. 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  Austin Garza 02/05/2011, 8:44 AM

## 2011-02-05 NOTE — Progress Notes (Signed)
  Echocardiogram 2D Echocardiogram has been performed.  Juanita Laster Drayke Grabel, RDCS 02/05/2011, 12:14 PM

## 2011-02-05 NOTE — Progress Notes (Signed)
Subjective: [name] is a 60 yo man, with a history of 1 prior provoked DVT 2 years ago, presenting with significant DOE after an 8-hour drive, was found to have bilateral PE moderate-large clot burden on CTA.  Mr Austin Garza reports that he cannot stop working and does not want to find a PCP in the [place] area.  He states he is only here on the weekend and already has doctors that he trusts who he can ask for questions. He previously would stop at random labs and get his INR checked and would text message a doctor at [facility] the results for modification of his warfarin.  His "cardiologist from [facility]" suggested that he take [facility] for his PE so that he does not need to be monitored.  He wants to walk and be discharged.   No events o/n. Denies SOB, hemoptysis, chest pain, leg swelling or other complaint. Has not ambulated.    Objective: Vital signs in last 24 hours: Filed Vitals:   02/05/11 0200 02/05/11 0300 02/05/11 0400 02/05/11 0752  BP:   116/59 129/69  Pulse: 60 56 55 62  Temp:   98.5 F (36.9 C) 98.6 F (37 C)  TempSrc:    Oral  Resp:   18 16  Height:      Weight:      SpO2: 91% 94% 94% 95%   Weight change:   Intake/Output Summary (Last 24 hours) at 02/05/11 1610 Last data filed at 02/05/11 0800  Gross per 24 hour  Intake 1372.99 ml  Output   1585 ml  Net -212.01 ml   Physical Exam:  General: resting in bed Cardiac: RRR, no rubs, murmurs or gallops, No JVD appreciated Pulm: clear to auscultation bilaterally, moving normal volumes of air Abd: soft, nontender, nondistended, BS present Ext: warm and well perfused, no pedal edema, no redness, pain on palpation corded popliteal vessels or other sign of DVT in the leg.  Neuro: alert and oriented X3, cranial nerves II-XII grossly intact, No focal neurologic deficits.   Lab Results: Basic Metabolic Panel:  Lab 02/05/11 9604 02/04/11 0500  NA 138 139  K 3.8 4.8  CL 104 104  CO2 25 27  GLUCOSE 113* 115*  BUN 12 11    CREATININE 1.13 1.16  CALCIUM 8.7 8.7  MG -- --  PHOS -- --   CBC:  Lab 02/05/11 0455 02/04/11 0500  WBC 13.5* 12.1*  NEUTROABS -- --  HGB 15.3 16.4  HCT 44.6 46.6  MCV 87.6 88.1  PLT 240 217   Cardiac Enzymes:  Lab 02/04/11 1703 02/04/11 0916 02/04/11 0001  CKTOTAL 90 100 120  CKMB 2.8 3.4 3.9  CKMBINDEX -- -- --  TROPONINI <0.30 <0.30 0.42*   BNP:  Lab 02/04/11 0045  POCBNP 723.3*   D-Dimer:  Lab 02/03/11 1419  DDIMER 5.10*   Coagulation:  Lab 02/04/11 0500  LABPROT 13.3  INR 0.99     Micro Results: Recent Results (from the past 240 hour(s))  MRSA PCR SCREENING     Status: Normal   Collection Time   02/03/11  8:54 PM      Component Value Range Status Comment   MRSA by PCR NEGATIVE  NEGATIVE  Final    Studies/Results: Dg Chest 2 View  02/03/2011  *RADIOLOGY REPORT*  Clinical Data: Chest pain and shortness of breath for 1 week. Bronchitis.  CHEST - 2 VIEW  Comparison: None.  Findings: Midline trachea.  Normal heart size and mediastinal contours. No pleural  effusion or pneumothorax.  Mild reticular nodular/interstitial opacity.  Slightly upper lobe predominant.  IMPRESSION: 1.  No acute cardiopulmonary disease. 2. Slight upper lobe predominant reticular nodular opacity. Favored to represent the sequelae of chronic bronchitis/smoking. In the acute setting, atypical bacterial or viral pneumonia could look similar.  Original Report Authenticated By: Consuello Bossier, M.D.   Ct Angio Chest W/cm &/or Wo Cm  2011-02-11  *RADIOLOGY REPORT*  Clinical Data:  60 year old male with shortness of breath, chest pain and elevated D-dimer.  CT ANGIOGRAPHY CHEST WITH CONTRAST  Technique:  Multidetector CT imaging of the chest was performed using the standard protocol during bolus administration of intravenous contrast.  Multiplanar CT image reconstructions including MIPs were obtained to evaluate the vascular anatomy.  Contrast: OMNIPAQUE IOHEXOL 350 MG/ML IV SOLN   Comparison:  February 11, 2011 chest radiograph  Findings:  This is a technically satisfactory study.  Pulmonary emboli are identified within the distal main pulmonary arteries bilaterally extending into the right middle lobe, lingula, bilateral lower lobes and right upper lobe.   A moderate to large clot burden is identified with evidence of right heart strain.  There are no pleural or pericardial effusions present. No enlarged lymph nodes are identified.  The lungs are clear. There is no evidence of airspace disease, consolidation, infarct, mass or endobronchial/endotracheal lesions.  No acute or suspicious bony abnormalities are identified. Fatty infiltration of the visualized liver is identified.  Review of the MIP images confirms the above findings.  IMPRESSION: Bilateral pulmonary emboli with moderate to large clot burden and evidence of right heart strain.  Fatty infiltration of the liver.  Critical Value/emergent results were called by telephone at the time of interpretation on February 11, 2011  at 5:30 p.m.  to  Austin Garza, who verbally acknowledged these results.  Original Report Authenticated By: Rosendo Gros, M.D.    EKG from 11/25 shows S1 Q3 T3 consistent with pulmonary embolism. This is much more pronounced from  EKG on 11-Feb-2023 and lead 3 t wave inversion is new.  Medications: I have reviewed the patient's current medications. Scheduled Meds:    . docusate sodium  100 mg Oral BID  . heparin  5,000 Units Intravenous Once  . patient's guide to using coumadin book   Does not apply Once  . warfarin  10 mg Oral ONCE-1800  . warfarin   Does not apply Once   Continuous Infusions:    . sodium chloride 10 mL/hr at 02/04/11 2000  . heparin 2,250 Units/hr (02/05/11 0704)   PRN Meds:.acetaminophen, acetaminophen, ibuprofen, ondansetron (ZOFRAN) IV, ondansetron, oxyCODONE, polyethylene glycol Assessment/Plan: Principal Problem: 1) Pulmonary embolism, bilateral - EKG and CTA classic for large PE with  right heart strain.Concern is that moderate to large clot burden could potentially lead to hemodynamic compromise. Will immobilize and get LE dopplars to prevent further clot burden. Overall will probably need lifelong warfarin without significant change in lifestyle.  Thrombophilia panel started after heparin, but will follow for facter 5 leiden, and prothrombin mut.  I discussed with [name] that while rivaroxaban has been shown to be effective for the treatment of PE in 1 open label study, it is not routinely recommended at this point for PE. Also an antidote for rivaroxaban is presently unavailable. Thus if he were to bleed we would have to way of reversing his anticoagulation. While if he absolutely refuses to get his INR followed regularly, this might be the best option, it is not my first choice of therapy.  --  2D echo to eval for R heart function to happen today -- Lower extremity Dopplers to happen today -- heparin per pharm -- warfarin per pharm -- bedrest for until echo and LE Dopplers return low risk of new clot burden -- CBC and BMP in AM for plts, Hb and Cr.  Active Problems: 2) Bronchitis - I can hear nothing on exam. Cough is likely irritation 2/2 PE rather than continued bronchitis. Moxifloxacin course was 8 days and has been afebrile since admision -- ibuprophen PRN  3) Tobacco abuse - Mr. [place] absolutely needs to discontinue smoking. This was reiterated to him again today.   4) DVT proph - Heparin per pharm with warfarin to follow   LOS: 2 days   Annisten Manchester 02/05/2011, 8:22 AM

## 2011-02-05 NOTE — Progress Notes (Signed)
ANTICOAGULATION CONSULT NOTE - Follow Up Consult  Pharmacy Consult for Heparin Indication: pulmonary embolus - large clot burden  No Known Allergies  Patient Measurements: Height: 5' 6.5" (168.9 cm) Weight: 188 lb 11.4 oz (85.6 kg) IBW/kg (Calculated) : 64.95  Adjusted Body Weight: 83 kg  Vital Signs: Temp: 98.2 F (36.8 C) (11/25 2000) Temp src: Oral (11/25 2000) BP: 114/78 mmHg (11/25 2300) Pulse Rate: 67  (11/25 2000)  Labs:  Basename 02/05/11 0001 02/04/11 1703 02/04/11 0916 02/04/11 0500 02/04/11 0001 02/03/11 1419  HGB -- -- -- 16.4 -- 17.9*  HCT -- -- -- 46.6 -- 50.8  PLT -- -- -- 217 -- 242  APTT -- -- -- -- -- --  LABPROT -- -- -- 13.3 -- --  INR -- -- -- 0.99 -- --  HEPARINUNFRC 0.31 0.32 <0.10* -- -- --  CREATININE -- -- -- 1.16 -- 1.12  CKTOTAL -- 90 100 -- 120 --  CKMB -- 2.8 3.4 -- 3.9 --  TROPONINI -- <0.30 <0.30 -- 0.42* --   Estimated Creatinine Clearance: 69.2 ml/min (by C-G formula based on Cr of 1.16).  Assessment: 60yo male with PE with large clot burden remains therapeutic on heparin though at very low end of goal.  Goal of Therapy:  Heparin level 0.3-0.7 units/ml  Plan:  Will increase gtt slightly to 2000 units/hr to ensure remains within goal and continue to monitor.  Vernard Gambles, PharmD, BCPS  02/05/2011, 1:53 AM

## 2011-02-05 NOTE — Progress Notes (Signed)
After slight increase to 2000 units/hr pt HL has migrated downward to 0.24 this am. Likely due to large clot burden. Will increase more agressively to 2250/hr and recheck a HL in 6 hours.   Janice Coffin

## 2011-02-05 NOTE — Progress Notes (Signed)
Internal Medicine Teaching Service Attending Note Date: 02/05/2011  Patient name: Austin Garza  Medical record number: [id number]  Date of birth: [birth year]    This patient has been seen and discussed with the house staff. Please see their note for complete details. I concur with their findings. This is second provoked DVT. Will need lifelong anticoagulation. Hypercoag panel so far negative. Dopplers show chronic L DVT and acute R DVT. The big decision is how to anticoag. Pt not interested in [place] PCP. Has cards in [place] who suggested rivoraxiban - but not FDA approved for PE tx. Pt will see in insurance will cover. Last VTE, pt would get INR at whatever lab he has at that day and text results to an MD in [facility] who would text back adjustment. Will need to see if that MD still willing to adjust INR's.   Not ready for D/C today.  BUTCHER,ELIZABETH 02/05/2011, 2:53 PM

## 2011-02-06 DIAGNOSIS — I2699 Other pulmonary embolism without acute cor pulmonale: Secondary | ICD-10-CM

## 2011-02-06 LAB — CBC
Hemoglobin: 15.2 g/dL (ref 13.0–17.0)
MCH: 30.9 pg (ref 26.0–34.0)
Platelets: 236 10*3/uL (ref 150–400)
RBC: 4.92 MIL/uL (ref 4.22–5.81)
WBC: 12.2 10*3/uL — ABNORMAL HIGH (ref 4.0–10.5)

## 2011-02-06 LAB — PROTIME-INR
INR: 1.3 (ref 0.00–1.49)
Prothrombin Time: 16.4 seconds — ABNORMAL HIGH (ref 11.6–15.2)

## 2011-02-06 LAB — HEPARIN LEVEL (UNFRACTIONATED)
Heparin Unfractionated: 0.23 IU/mL — ABNORMAL LOW (ref 0.30–0.70)
Heparin Unfractionated: 0.14 IU/mL — ABNORMAL LOW (ref 0.30–0.70)

## 2011-02-06 MED ORDER — WARFARIN SODIUM 6 MG PO TABS
6.0000 mg | ORAL_TABLET | Freq: Once | ORAL | Status: AC
  Administered 2011-02-06: 6 mg via ORAL
  Filled 2011-02-06: qty 1

## 2011-02-06 MED ORDER — HEPARIN (PORCINE) IN NACL 100-0.45 UNIT/ML-% IJ SOLN
2850.0000 [IU]/h | INTRAMUSCULAR | Status: DC
  Administered 2011-02-06 – 2011-02-07 (×3): 2850 [IU]/h via INTRAVENOUS
  Filled 2011-02-06: qty 250

## 2011-02-06 MED ORDER — HEPARIN BOLUS VIA INFUSION
2000.0000 [IU] | Freq: Once | INTRAVENOUS | Status: AC
  Administered 2011-02-06: 2000 [IU] via INTRAVENOUS
  Filled 2011-02-06: qty 2000

## 2011-02-06 NOTE — Progress Notes (Signed)
Subjective:   60 yo gentlemen admitted with bilateral PE with big clot burden, now on heparin/coumadin.  Echo yesterday with EF 60-65%, RV mildly dilated but normal systolic function.  Preliminary LE dopplers with acute DVT in Rt mid to distal femoral vein and popliteal vein.  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  No complaints of CP/SOB.  Resting comfortably.    INR: 1.3   Intake/Output Summary (Last 24 hours) at 02/06/11 0751 Last data filed at 02/06/11 0700  Gross per 24 hour  Intake 1065.33 ml  Output      1 ml  Net 1064.33 ml    Current meds:    . docusate sodium  100 mg Oral BID  . warfarin  10 mg Oral ONCE-1800  . warfarin   Does not apply Once   Infusions:    . sodium chloride 10 mL/hr at 02/06/11 0700  . heparin 26 mL/hr (02/06/11 0700)     Objective:  Blood pressure 135/78, pulse 66, temperature 97.7 F (36.5 C), temperature source Oral, resp. rate 18, height 5' 6.5" (1.689 m), weight 86 kg (189 lb 9.5 oz), SpO2 92.00%. Weight change:   Physical Exam: General:  Well appearing. No resp difficulty HEENT: normal Neck: supple. JVP flat . Carotids 2+ bilat; no bruits. No lymphadenopathy or thryomegaly appreciated. Cor: PMI nondisplaced. Regular rate & rhythm. No rubs, gallops or murmurs. No RV lift Lungs: clear Abdomen: soft, nontender, nondistended. No hepatosplenomegaly. No bruits or masses. Good bowel sounds. Extremities: no cyanosis, clubbing, rash, edema Neuro: alert & orientedx3, cranial nerves grossly intact. moves all 4 extremities w/o difficulty. Affect pleasant  Telemetry: Sinus rhythm 50-80s  Lab Results: Basic Metabolic Panel:  Lab 02/05/11 4098 02/04/11 0500 02/03/11 1419  NA 138 139 137  K 3.8 4.8 --  CL 104 104 102  CO2 25 27 24   GLUCOSE 113* 115* 113*  BUN 12 11 10   CREATININE 1.13 1.16 1.12  CALCIUM 8.7 8.7 9.4  MG -- -- --  PHOS -- -- --   Liver Function Tests: No results found for this basename:  AST:5,ALT:5,ALKPHOS:5,BILITOT:5,PROT:5,ALBUMIN:5 in the last 168 hours No results found for this basename: LIPASE:5,AMYLASE:5 in the last 168 hours No results found for this basename: AMMONIA:5 in the last 168 hours CBC:  Lab 02/06/11 0500 02/05/11 0455 02/04/11 0500 02/03/11 1419  WBC 12.2* 13.5* 12.1* 11.8*  NEUTROABS -- -- -- --  HGB 15.2 15.3 16.4 17.9*  HCT 43.4 44.6 46.6 50.8  MCV 88.2 87.6 88.1 87.6  PLT 236 240 217 242   Cardiac Enzymes:  Lab 02/04/11 1703 02/04/11 0916 02/04/11 0001  CKTOTAL 90 100 120  CKMB 2.8 3.4 3.9  CKMBINDEX -- -- --  TROPONINI <0.30 <0.30 0.42*   BNP:  Lab 02/04/11 0045  POCBNP 723.3*   CBG: No results found for this basename: GLUCAP:5 in the last 168 hours Microbiology: No results found for this basename: cult   No results found for this basename: CULT:2,SDES:2 in the last 168 hours  Imaging: No results found.   ASSESSMENT:  1) Large bilateral PE 2) DVT - acute Rt mid to distal femoral and popliteal vein and chronic Lt leg popliteal vein  3) COPD with ongoing tobacco use  PLAN/DISCUSSION:  Remains hemodynamically stable.  Echo shows preserved RV function.  Will continue on heparin and coumadin at this time with at least 24 hour overlap.  Will need to determine coumadin follow up.  Will discuss possible use of rivaroxaban instead of coumadin  as recently FDA approved for PE, will discuss further with Dr. Gala Romney but continue coumadin at this time.      LOS: 3 days    Robbi Garter, [place] 02/06/2011, 7:51 AM  Patient seen and examined with Ulyess Blossom PA-C. We discussed all aspects of the encounter. I agree with the assessment and plan as stated above. Clinically doing well. Echo with only mild RV dilation. Good function.  I discussed several issues with him  1) Xarelto - told him that although FDA approved recently for VTE, I am not comfortable with it in the setting of such a large clot and we also reviewed fact that there is no  reversal agent should he have a bleed. Given the fact that BS/BC likely won't cover he is ok with using coumadin.  Trying to get home INR meter.  2) Lovenox vs heparin - he currently has very high heparin requirement given size of clot. We discussed with pharmacy who felt weight-based lovenox would still be sufficient as dosing far more stable  3) Need for temporary IVC filter -  Given already high burden of clot in lung he would likely be unable to survive another large clot. We will check with vascular lab to see how big the residual DVT is. If large, may consider temporary IVC filter prior to d/c.    Arvilla Meres MD 9:37 AM

## 2011-02-06 NOTE — Progress Notes (Signed)
ANTICOAGULATION CONSULT NOTE - Follow Up Consult  Pharmacy Consult for Heparin/coumadin Indication: pulmonary embolus - large clot burden  No Known Allergies  Patient Measurements: Height: 5' 6.5" (168.9 cm) Weight: 189 lb 9.5 oz (86 kg) IBW/kg (Calculated) : 64.95  Adjusted Body Weight: 83 kg  Vital Signs: Temp: 98.1 F (36.7 C) (11/27 1204) Temp src: Oral (11/27 1204) BP: 107/64 mmHg (11/27 0808) Pulse Rate: 60  (11/27 0808)  Labs:  Basename 02/06/11 1130 02/06/11 0500 02/05/11 1307 02/05/11 0455 02/04/11 1703 02/04/11 0916 02/04/11 0500 02/04/11 0001 02/03/11 1419  HGB -- 15.2 -- 15.3 -- -- -- -- --  HCT -- 43.4 -- 44.6 -- -- 46.6 -- --  PLT -- 236 -- 240 -- -- 217 -- --  APTT -- -- -- -- -- -- -- -- --  LABPROT -- 16.4* 13.7 -- -- -- 13.3 -- --  INR -- 1.30 1.03 -- -- -- 0.99 -- --  HEPARINUNFRC 0.14* 0.23* 0.46 -- -- -- -- -- --  CREATININE -- -- -- 1.13 -- -- 1.16 -- 1.12  CKTOTAL -- -- -- -- 90 100 -- 120 --  CKMB -- -- -- -- 2.8 3.4 -- 3.9 --  TROPONINI -- -- -- -- <0.30 <0.30 -- 0.42* --   Estimated Creatinine Clearance: 71.3 ml/min (by C-G formula based on Cr of 1.13).   Assessment: 61 yo male with PE on heparin/coumadin. Heparin is at 2600 units/hr and  is  below goal (0.16). INR=1.3 and trend up since 11/26. Noted plans for lovenox.  Goal of Therapy:  Heparin level 0.3-0.7 units/ml INR=2-3  Plan:  -Will give heparin bolus 2000 units then increase to 2850 units/hr and check in 6hrs -Coumadin 6mg  po today  Harland German, Pharm D  02/06/2011 12:56 PM

## 2011-02-06 NOTE — Progress Notes (Signed)
ANTICOAGULATION CONSULT NOTE - Follow Up Consult  Pharmacy Consult for heparin Indication: PE/DVT w/ lg clot burden  No Known Allergies  Patient Measurements: Height: 5' 6.5" (168.9 cm) Weight: 188 lb 11.4 oz (85.6 kg) IBW/kg (Calculated) : 64.95  Adjusted Body Weight: 82.6 kg  Vital Signs: Temp: 97.7 F (36.5 C) (11/27 0400) Temp src: Oral (11/27 0400) BP: 135/78 mmHg (11/27 0400) Pulse Rate: 66  (11/26 2000)  Labs:  Basename 02/06/11 0500 02/05/11 1307 02/05/11 0455 02/04/11 1703 02/04/11 0916 02/04/11 0500 02/04/11 0001 02/03/11 1419  HGB 15.2 -- 15.3 -- -- -- -- --  HCT 43.4 -- 44.6 -- -- 46.6 -- --  PLT 236 -- 240 -- -- 217 -- --  APTT -- -- -- -- -- -- -- --  LABPROT 16.4* 13.7 -- -- -- 13.3 -- --  INR 1.30 1.03 -- -- -- 0.99 -- --  HEPARINUNFRC 0.23* 0.46 0.24* -- -- -- -- --  CREATININE -- -- 1.13 -- -- 1.16 -- 1.12  CKTOTAL -- -- -- 90 100 -- 120 --  CKMB -- -- -- 2.8 3.4 -- 3.9 --  TROPONINI -- -- -- <0.30 <0.30 -- 0.42* --   Estimated Creatinine Clearance: 71.1 ml/min (by C-G formula based on Cr of 1.13).  Medications:  Scheduled:    . docusate sodium  100 mg Oral BID  . warfarin  10 mg Oral ONCE-1800  . warfarin   Does not apply Once   Infusions:    . sodium chloride 10 mL/hr at 02/06/11 0400  . heparin 22.5 mL/hr (02/06/11 0400)   Assessment: 60yo male again subtherapeutic on heparin for DVT/PE with large clot burden; has reached therapeutic level a few times but consistently trends back down.  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  Goal of Therapy:  Heparin level 0.3-0.7 units/ml   Plan:  Will increase heparin gtt by 4 units/kg/hr to 2600 units/hr and check level in 6hr.  Colleen Can PharmD BCPS 02/06/2011,5:41 AM

## 2011-02-06 NOTE — Progress Notes (Signed)
ANTICOAGULATION CONSULT NOTE - Follow Up Consult  Pharmacy Consult for Heparin Indication: pulmonary embolus - large clot burden  No Known Allergies  Patient Measurements: Height: 5' 6.5" (168.9 cm) Weight: 189 lb 9.5 oz (86 kg) IBW/kg (Calculated) : 64.95  Adjusted Body Weight: 83 kg  Vital Signs: Temp: 98.1 F (36.7 C) (11/27 2000) Temp src: Oral (11/27 2000) BP: 146/82 mmHg (11/27 1720) Pulse Rate: 71  (11/27 1720)  Labs:  Basename 02/06/11 2018 02/06/11 1130 02/06/11 0500 02/05/11 1307 02/05/11 0455 02/04/11 1703 02/04/11 0916 02/04/11 0500 02/04/11 0001  HGB -- -- 15.2 -- 15.3 -- -- -- --  HCT -- -- 43.4 -- 44.6 -- -- 46.6 --  PLT -- -- 236 -- 240 -- -- 217 --  APTT -- -- -- -- -- -- -- -- --  LABPROT -- -- 16.4* 13.7 -- -- -- 13.3 --  INR -- -- 1.30 1.03 -- -- -- 0.99 --  HEPARINUNFRC 0.47 0.14* 0.23* -- -- -- -- -- --  CREATININE -- -- -- -- 1.13 -- -- 1.16 --  CKTOTAL -- -- -- -- -- 90 100 -- 120  CKMB -- -- -- -- -- 2.8 3.4 -- 3.9  TROPONINI -- -- -- -- -- <0.30 <0.30 -- 0.42*   Estimated Creatinine Clearance: 71.3 ml/min (by C-G formula based on Cr of 1.13).   Assessment: 60 yo male with PE on heparin/coumadin, overlap day 3. Heparin level=0.47, at goal.   Goal of Therapy:  Heparin level 0.3-0.7 units/ml   Plan:  -Will continue heparin at 2850 units/hr. F/u heparin level in am.  Wendie Simmer, PharmD, BCPS Clinical Pharmacist  Pager: [phone]   02/06/2011 9:32 PM

## 2011-02-06 NOTE — Progress Notes (Signed)
Subjective: [name] is a 60 yo man, with a history of 1 prior provoked DVT 2 years ago, presenting with significant DOE after an 8-hour drive, was found to have bilateral PE moderate-large clot burden on CTA.  No events o/n. Heparin levels have been difficult to maintain.  Denies SOB, hemoptysis, chest pain, leg swelling or other complaint. Has not ambulated.    Objective: Vital signs in last 24 hours: Filed Vitals:   02/06/11 0806 02/06/11 0808 02/06/11 1200 02/06/11 1204  BP:  107/64    Pulse:  60    Temp: 98.2 F (36.8 C)   98.1 F (36.7 C)  TempSrc: Oral   Oral  Resp:      Height:      Weight:      SpO2:  95% 98%    Weight change:   Intake/Output Summary (Last 24 hours) at 02/06/11 1525 Last data filed at 02/06/11 1100  Gross per 24 hour  Intake    360 ml  Output      3 ml  Net    357 ml   Physical Exam:  General: resting in bed Cardiac: RRR, no rubs, murmurs or gallops, No JVD appreciated Pulm: clear to auscultation bilaterally, moving normal volumes of air Abd: soft, nontender, nondistended, BS present Ext: warm and well perfused, no pedal edema, no redness, pain on palpation corded popliteal vessels or other sign of DVT in the leg.  Neuro: alert and oriented X3, cranial nerves II-XII grossly intact, No focal neurologic deficits.   Lab Results: Basic Metabolic Panel:  Lab 02/05/11 9604 02/04/11 0500  NA 138 139  K 3.8 4.8  CL 104 104  CO2 25 27  GLUCOSE 113* 115*  BUN 12 11  CREATININE 1.13 1.16  CALCIUM 8.7 8.7  MG -- --  PHOS -- --   CBC:  Lab 02/06/11 0500 02/05/11 0455  WBC 12.2* 13.5*  NEUTROABS -- --  HGB 15.2 15.3  HCT 43.4 44.6  MCV 88.2 87.6  PLT 236 240   Cardiac Enzymes:  Lab 02/04/11 1703 02/04/11 0916 02/04/11 0001  CKTOTAL 90 100 120  CKMB 2.8 3.4 3.9  CKMBINDEX -- -- --  TROPONINI <0.30 <0.30 0.42*   BNP:  Lab 02/04/11 0045  POCBNP 723.3*   D-Dimer:  Lab Feb 16, 2011 1419  DDIMER 5.10*   Coagulation:  Lab  02/06/11 0500 02/05/11 1307 02/04/11 0500  LABPROT 16.4* 13.7 13.3  INR 1.30 1.03 0.99     Micro Results: Recent Results (from the past 240 hour(s))  MRSA PCR SCREENING     Status: Normal   Collection Time   2011/02/16  8:54 PM      Component Value Range Status Comment   MRSA by PCR NEGATIVE  NEGATIVE  Final    Studies/Results: 2D TTE echo from 11/26 - Left ventricle: The cavity size was normal. Wall thickness was increased in a pattern of mild LVH. Systolic function was normal. The estimated ejection fraction was in the range of 60% to 65%. - Right ventricle: The cavity size was mildly dilated.  LE Dopplar Studies 11/26 - Findings consistent with acute deep vein thrombosis involving the right lower extremity. 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 - No obvious evidence of Baker's cyst on the right or left.  EKG from 11/25 shows S1 Q3 T3 consistent with pulmonary embolism. This is much more pronounced from  EKG on 2023/02/16 and lead 3 t wave inversion is new.  Medications: I have reviewed the patient's current medications. Scheduled Meds:    . docusate sodium  100 mg Oral BID  . heparin  2,000 Units Intravenous Once  . warfarin  10 mg Oral ONCE-1800  . warfarin   Does not apply Once   Continuous Infusions:    . sodium chloride 10 mL/hr at 02/06/11 0700  . heparin 2,850 Units/hr (02/06/11 1400)  . DISCONTD: heparin 2,600 Units/hr (02/06/11 1201)   PRN Meds:.acetaminophen, acetaminophen, ibuprofen, ondansetron (ZOFRAN) IV, ondansetron, oxyCODONE, polyethylene glycol Assessment/Plan: Principal Problem: 1) Pulmonary embolism, bilateral - EKG and CTA classic for large PE with right heart strain. Concern is that moderate to large clot burden could potentially lead to hemodynamic compromise. Needs lifelong antigoag. Wants to pursue warfarin as option as rivaroxiban will be very expensive and irreversible. He does need to establish  care with a doctor who will monitor his INR and adjust warfain accordingly.  2D echo showed only mild RV dilation and some LVH.  Dopplars pos for acute clot on R and old clot on L. Dr Teressa Lower pursuing need for possible IVC filter.   -- will follow thrombophilia panel for facter 5 leiden, and prothrombin mut.  -- heparin per pharm -- warfarin per pharm -- bedrest for now until we determine IVC filter need -- CBC and BMP in AM for plts, Hb and Cr. -- He does need to establish care with a doctor who will monitor his INR and adjust warfain accordingly.    Active Problems: 2) Bronchitis -Resolved.  Cough is likely irritation 2/2 PE rather than continued bronchitis. Moxifloxacin course was 8 days and has been afebrile since admision -- ibuprophen PRN  3) Tobacco abuse - Mr. Zinn absolutely needs to discontinue smoking.   4) DVT proph - Heparin per pharm with warfarin to follow   LOS: 3 days   Kelly Ranieri 02/06/2011, 3:25 PM

## 2011-02-07 ENCOUNTER — Other Ambulatory Visit: Payer: Self-pay | Admitting: Vascular Surgery

## 2011-02-07 ENCOUNTER — Encounter (HOSPITAL_COMMUNITY)
Admission: EM | Disposition: A | Discharge status: Home / Self Care | Payer: Self-pay | Source: Ambulatory Visit | Attending: Internal Medicine

## 2011-02-07 DIAGNOSIS — I2699 Other pulmonary embolism without acute cor pulmonale: Secondary | ICD-10-CM

## 2011-02-07 DIAGNOSIS — Z95828 Presence of other vascular implants and grafts: Secondary | ICD-10-CM

## 2011-02-07 DIAGNOSIS — T81718A Complication of other artery following a procedure, not elsewhere classified, initial encounter: Secondary | ICD-10-CM

## 2011-02-07 HISTORY — DX: Presence of other vascular implants and grafts: Z95.828

## 2011-02-07 HISTORY — PX: INSERTION OF VENA CAVA FILTER: SHX5513

## 2011-02-07 LAB — CBC
HCT: 47.6 % (ref 39.0–52.0)
Hemoglobin: 16.6 g/dL (ref 13.0–17.0)
MCH: 30.9 pg (ref 26.0–34.0)
MCHC: 34.9 g/dL (ref 30.0–36.0)
MCV: 88.6 fL (ref 78.0–100.0)
RDW: 12.8 % (ref 11.5–15.5)

## 2011-02-07 LAB — BASIC METABOLIC PANEL
BUN: 10 mg/dL (ref 6–23)
CO2: 28 mEq/L (ref 19–32)
Chloride: 101 mEq/L (ref 96–112)
Creatinine, Ser: 1.1 mg/dL (ref 0.50–1.35)
GFR calc Af Amer: 82 mL/min — ABNORMAL LOW (ref >90)
Potassium: 4 mEq/L (ref 3.5–5.1)
Sodium: 138 mEq/L (ref 135–145)

## 2011-02-07 LAB — PROTIME-INR: Prothrombin Time: 19.7 seconds — ABNORMAL HIGH (ref 11.6–15.2)

## 2011-02-07 LAB — FACTOR 5 LEIDEN

## 2011-02-07 SURGERY — INSERTION OF VENA CAVA FILTER
Anesthesia: LOCAL

## 2011-02-07 MED ORDER — HEPARIN (PORCINE) IN NACL 100-0.45 UNIT/ML-% IJ SOLN
2850.0000 [IU]/h | INTRAMUSCULAR | Status: DC
  Administered 2011-02-08: 2850 [IU]/h via INTRAVENOUS
  Filled 2011-02-07 (×3): qty 250

## 2011-02-07 MED ORDER — WARFARIN SODIUM 6 MG PO TABS
6.0000 mg | ORAL_TABLET | Freq: Once | ORAL | Status: AC
  Administered 2011-02-07: 6 mg via ORAL
  Filled 2011-02-07: qty 1

## 2011-02-07 MED ORDER — HEPARIN (PORCINE) IN NACL 2-0.9 UNIT/ML-% IJ SOLN
INTRAMUSCULAR | Status: AC
  Filled 2011-02-07: qty 1000

## 2011-02-07 MED ORDER — LIDOCAINE HCL (PF) 1 % IJ SOLN
INTRAMUSCULAR | Status: AC
  Filled 2011-02-07: qty 30

## 2011-02-07 NOTE — Progress Notes (Signed)
Subjective: Austin Garza is a 60 yo man, with a history of 1 prior provoked DVT 2 years ago, presenting with significant DOE after an 8-hour drive, was found to have bilateral PE moderate-large clot burden on CTA.  No events o/n.  Wants to walk up stairs to prove to himself that he is ok. Denies SOB, hemoptysis, chest pain, leg swelling or other complaint. He states his cardiologist will follow INRs and he is trying to get his insurance company to purchase a home INR monitor for him.  Objective: Vital signs in last 24 hours: Filed Vitals:   02/06/11 2300 02/07/11 0015 02/07/11 0425 02/07/11 0735  BP:  129/69 119/75 143/84  Pulse:  61 58 65  Temp:  98.5 F (36.9 C) 98.7 F (37.1 C) 97.9 F (36.6 C)  TempSrc:  Oral Oral   Resp:  19 20   Height:      Weight:      SpO2: 94% 97% 97% 98%   Weight change:   Intake/Output Summary (Last 24 hours) at 02/07/11 0750 Last data filed at 02/07/11 0600  Gross per 24 hour  Intake    708 ml  Output      4 ml  Net    704 ml   Physical Exam:  General: resting in bed Cardiac: RRR, no murmurs or gallops, P2 heart sound appears less prominent today Pulm: clear to auscultation bilaterally, moving normal volumes of air Abd: soft, nontender, nondistended, BS present Ext: warm and well perfused.  Neuro: alert and oriented X3, cranial nerves II-XII grossly intact, No focal neurologic deficits.   Lab Results: Basic Metabolic Panel:  Lab 02/07/11 1610 02/05/11 0455  NA 138 138  K 4.0 3.8  CL 101 104  CO2 28 25  GLUCOSE 112* 113*  BUN 10 12  CREATININE 1.10 1.13  CALCIUM 9.0 8.7  MG -- --  PHOS -- --   CBC:  Lab 02/07/11 0540 02/06/11 0500  WBC 11.7* 12.2*  NEUTROABS -- --  HGB 16.6 15.2  HCT 47.6 43.4  MCV 88.6 88.2  PLT 269 236   Cardiac Enzymes:  Lab 02/04/11 1703 02/04/11 0916 02/04/11 0001  CKTOTAL 90 100 120  CKMB 2.8 3.4 3.9  CKMBINDEX -- -- --  TROPONINI <0.30 <0.30 0.42*   BNP:  Lab 02/04/11 0045  POCBNP 723.3*    D-Dimer:  Lab 02/03/11 1419  DDIMER 5.10*   Coagulation:  Lab 02/07/11 0540 02/06/11 0500 02/05/11 1307 02/04/11 0500  LABPROT 19.7* 16.4* 13.7 13.3  INR 1.64* 1.30 1.03 0.99     Micro Results: Recent Results (from the past 240 hour(s))  MRSA PCR SCREENING     Status: Normal   Collection Time   02/03/11  8:54 PM      Component Value Range Status Comment   MRSA by PCR NEGATIVE  NEGATIVE  Final    Studies/Results: 2D TTE echo from 11/26 - Left ventricle: The cavity size was normal. Wall thickness was increased in a pattern of mild LVH. Systolic function was normal. The estimated ejection fraction was in the range of 60% to 65%. - Right ventricle: The cavity size was mildly dilated.  LE Dopplar Studies 11/26 - Findings consistent with acute deep vein thrombosis involving the right lower extremity. 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 - No obvious evidence of Baker's cyst on the right or left.  EKG from 11/25 shows S1 Q3 T3 consistent with pulmonary embolism. This is much  more pronounced from  EKG on 11/24 and lead 3 t wave inversion is new.  Medications: I have reviewed the patient's current medications. Scheduled Meds:    . docusate sodium  100 mg Oral BID  . heparin  2,000 Units Intravenous Once  . warfarin  6 mg Oral Once  . warfarin   Does not apply Once   Continuous Infusions:    . sodium chloride 10 mL/hr at 02/06/11 0700  . heparin 28.5 mL/hr (02/07/11 0600)  . DISCONTD: heparin 2,600 Units/hr (02/06/11 1201)   PRN Meds:.acetaminophen, acetaminophen, ondansetron (ZOFRAN) IV, ondansetron, oxyCODONE, polyethylene glycol, DISCONTD: ibuprofen Assessment/Plan: Principal Problem: 1) Pulmonary embolism, bilateral -Needs lifelong antigoag, pursing warfarin as first choice. INR now 1.6. Now pursuing need for possible IVC filter.  I stressed to Austin Garza that we still need to monitor his INR and determine if  there is a risk of embolizing a piece of the clots in his leg and that he will very likely not feel his PEs occuring until it's too late.  I also told him not to delay next time he feels symptomatic.  Waiting 4 days for his family to leave when he knew something was wrong was a very dangerous decision.  He seems not to grasp this fact. Overall seems to have a lack of insight as to the severity of his disease.   -- IVC filter needs per cardiology. Appreciate Dr. Prescott Gum input.  -- will follow thrombophilia panel for facter 5 leiden, and prothrombin mut.  -- heparin per pharm -- warfarin per pharm  -- bedrest for now until we determine IVC filter need -- CBC and BMP in AM for plts, Hb and Cr.  Active Problems: 2) Bronchitis -Resolved.  Cough is likely irritation 2/2 PE rather than continued bronchitis. Moxifloxacin course was 8 days and has been afebrile since admision -- ibuprophen PRN  3) Tobacco abuse - Austin Garza absolutely needs to discontinue smoking.   4) DVT proph - Heparin and warfarin   LOS: 4 days   Navika Hoopes 02/07/2011, 7:50 AM

## 2011-02-07 NOTE — Progress Notes (Signed)
ANTICOAGULATION CONSULT NOTE - Follow Up Consult  Pharmacy Consult for Heparin/coumadin Indication:  DVT/PE  No Known Allergies  Patient Measurements: Height: 5' 6.5" (168.9 cm) Weight: 189 lb 9.5 oz (86 kg) IBW/kg (Calculated) : 64.95  Adjusted Body Weight: 83 kg  Vital Signs: Temp: 97.9 F (36.6 C) (11/28 0735) Temp src: Oral (11/28 0425) BP: 143/84 mmHg (11/28 0735) Pulse Rate: 65  (11/28 0735)  Labs:  Basename 02/07/11 0540 02/06/11 2018 02/06/11 1130 02/06/11 0500 02/05/11 1307 02/05/11 0455 02/04/11 1703 02/04/11 0916  HGB 16.6 -- -- 15.2 -- -- -- --  HCT 47.6 -- -- 43.4 -- 44.6 -- --  PLT 269 -- -- 236 -- 240 -- --  APTT -- -- -- -- -- -- -- --  LABPROT 19.7* -- -- 16.4* 13.7 -- -- --  INR 1.64* -- -- 1.30 1.03 -- -- --  HEPARINUNFRC 0.53 0.47 0.14* -- -- -- -- --  CREATININE 1.10 -- -- -- -- 1.13 -- --  CKTOTAL -- -- -- -- -- -- 90 100  CKMB -- -- -- -- -- -- 2.8 3.4  TROPONINI -- -- -- -- -- -- <0.30 <0.30   Estimated Creatinine Clearance: 73.2 ml/min (by C-G formula based on Cr of 1.1).   Assessment: 60 yo male with PE/DVT on heparin/coumadin (day 4 overlap). Noted, LE dopplers with acute DVT in Rt mid to distal femoral vein and popliteal vein (chronic DVT in left leg). Heparin is at 2850 units/hr and  is  at goal  (0.53). INR=1.64 and trend up since 11/26. Noted plans for lovenox.  Goal of Therapy:  Heparin level 0.3-0.7 units/ml INR=2-3  Plan:  -No heparin changes -Continue coumadin 6mg  po today (patient has done well on 6mg /day in the past)  Harland German, Pharm D  02/07/2011 8:58 AM

## 2011-02-07 NOTE — H&P (View-Only) (Signed)
Subjective:   60 yo gentlemen admitted with bilateral PE with big clot burden, now on heparin/coumadin.  Echo yesterday with EF 60-65%, RV mildly dilated but normal systolic function.  LE dopplers with acute DVT in Rt mid to distal femoral vein and popliteal vein.  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   No complaints of CP/SOB.  Resting comfortably.      Intake/Output Summary (Last 24 hours) at 02/07/11 0812 Last data filed at 02/07/11 0600  Gross per 24 hour  Intake    588 ml  Output      3 ml  Net    585 ml    Current meds:    . docusate sodium  100 mg Oral BID  . heparin  2,000 Units Intravenous Once  . warfarin  6 mg Oral Once  . warfarin   Does not apply Once   Infusions:    . sodium chloride 10 mL/hr at 02/06/11 0700  . heparin 28.5 mL/hr (02/07/11 0600)  . DISCONTD: heparin 2,600 Units/hr (02/06/11 1201)     Objective:  Blood pressure 143/84, pulse 65, temperature 97.9 F (36.6 C), temperature source Oral, resp. rate 20, height 5' 6.5" (1.689 m), weight 86 kg (189 lb 9.5 oz), SpO2 98.00%. Weight change:   Physical Exam: General:  Well appearing. No resp difficulty HEENT: normal Neck: supple. JVP flat . Carotids 2+ bilat; no bruits. No lymphadenopathy or thryomegaly appreciated. Cor: PMI nondisplaced. Regular rate & rhythm. No rubs, gallops or murmurs. No RV lift Lungs: clear Abdomen: soft, nontender, nondistended. No hepatosplenomegaly. No bruits or masses. Good bowel sounds. Extremities: no cyanosis, clubbing, rash, edema Neuro: alert & orientedx3, cranial nerves grossly intact. moves all 4 extremities w/o difficulty. Affect pleasant  Telemetry: Sinus rhythm 50-70s  Lab Results: Basic Metabolic Panel:  Lab 02/07/11 1610 02/05/11 0455 02/04/11 0500 02/03/11 1419  NA 138 138 139 137  K 4.0 3.8 -- --  CL 101 104 104 102  CO2 28 25 27 24   GLUCOSE 112* 113* 115* 113*  BUN 10 12 11 10   CREATININE 1.10 1.13 1.16 1.12  CALCIUM 9.0 8.7 8.7 9.4  MG --  -- -- --  PHOS -- -- -- --   Liver Function Tests: No results found for this basename: AST:5,ALT:5,ALKPHOS:5,BILITOT:5,PROT:5,ALBUMIN:5 in the last 168 hours No results found for this basename: LIPASE:5,AMYLASE:5 in the last 168 hours No results found for this basename: AMMONIA:5 in the last 168 hours CBC:  Lab 02/07/11 0540 02/06/11 0500 02/05/11 0455 02/04/11 0500 02/03/11 1419  WBC 11.7* 12.2* 13.5* 12.1* 11.8*  NEUTROABS -- -- -- -- --  HGB 16.6 15.2 15.3 16.4 17.9*  HCT 47.6 43.4 44.6 46.6 50.8  MCV 88.6 88.2 87.6 88.1 87.6  PLT 269 236 240 217 242   Cardiac Enzymes:  Lab 02/04/11 1703 02/04/11 0916 02/04/11 0001  CKTOTAL 90 100 120  CKMB 2.8 3.4 3.9  CKMBINDEX -- -- --  TROPONINI <0.30 <0.30 0.42*   BNP:  Lab 02/04/11 0045  POCBNP 723.3*   CBG: No results found for this basename: GLUCAP:5 in the last 168 hours Microbiology: No results found for this basename: cult   No results found for this basename: CULT:2,SDES:2 in the last 168 hours  Imaging: No results found.   ASSESSMENT:  1) Large bilateral PE       -No RV strain 2) DVT - acute Rt mid to distal femoral and popliteal vein and chronic Lt leg popliteal vein  3) COPD with  ongoing tobacco use  PLAN/DISCUSSION:  Remains hemodynamically stable.  Will continue with heparin and coumadin, goal INR >2.  Still trying to work out home INR meter with [employer].  Final LE doppler report shows acute DVT in rt leg, will review report to see how large the clot is with regards to temporary IVC filter.  The patient is not a fan of the filter but is willing to think about it as an option.  He would like to walk up stairs in order to see if his O2 Sats drop, will hold off on this until after dopplers reviewed.      LOS: 4 days    Robbi Garter, [place] 02/07/2011, 8:12 AM  Patient seen and examined with Ulyess Blossom PA-C. We discussed all aspects of the encounter. I agree with the assessment and plan as stated above. I have  reviewed vascular u/s personally with Dr. Myra Gianotti and it shows large residual clot in RLE from femoral vein into popliteal. Thus I think a IVC filter is reasonable consideration as given current clot burden in lung a secondary PE could be life-threatening. We discussed this at length and he agrees to proceed. Will have  Dr. Darrick Penna from vascular surgery to see him today for possible placement this afternoon. Keep NPO.  Total time spent 35 mins.   Daniel BensimhonMD 11:32 AM

## 2011-02-07 NOTE — Progress Notes (Addendum)
Subjective:   60 yo gentlemen admitted with bilateral PE with big clot burden, now on heparin/coumadin.  Echo yesterday with EF 60-65%, RV mildly dilated but normal systolic function.  LE dopplers with acute DVT in Rt mid to distal femoral vein and popliteal vein.  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   No complaints of CP/SOB.  Resting comfortably.      Intake/Output Summary (Last 24 hours) at 02/07/11 0812 Last data filed at 02/07/11 0600  Gross per 24 hour  Intake    588 ml  Output      3 ml  Net    585 ml    Current meds:    . docusate sodium  100 mg Oral BID  . heparin  2,000 Units Intravenous Once  . warfarin  6 mg Oral Once  . warfarin   Does not apply Once   Infusions:    . sodium chloride 10 mL/hr at 02/06/11 0700  . heparin 28.5 mL/hr (02/07/11 0600)  . DISCONTD: heparin 2,600 Units/hr (02/06/11 1201)     Objective:  Blood pressure 143/84, pulse 65, temperature 97.9 F (36.6 C), temperature source Oral, resp. rate 20, height 5' 6.5" (1.689 m), weight 86 kg (189 lb 9.5 oz), SpO2 98.00%. Weight change:   Physical Exam: General:  Well appearing. No resp difficulty HEENT: normal Neck: supple. JVP flat . Carotids 2+ bilat; no bruits. No lymphadenopathy or thryomegaly appreciated. Cor: PMI nondisplaced. Regular rate & rhythm. No rubs, gallops or murmurs. No RV lift Lungs: clear Abdomen: soft, nontender, nondistended. No hepatosplenomegaly. No bruits or masses. Good bowel sounds. Extremities: no cyanosis, clubbing, rash, edema Neuro: alert & orientedx3, cranial nerves grossly intact. moves all 4 extremities w/o difficulty. Affect pleasant  Telemetry: Sinus rhythm 50-70s  Lab Results: Basic Metabolic Panel:  Lab 02/07/11 0454 02/05/11 0455 02/04/11 0500 02/03/11 1419  NA 138 138 139 137  K 4.0 3.8 -- --  CL 101 104 104 102  CO2 28 25 27 24   GLUCOSE 112* 113* 115* 113*  BUN 10 12 11 10   CREATININE 1.10 1.13 1.16 1.12  CALCIUM 9.0 8.7 8.7 9.4  MG --  -- -- --  PHOS -- -- -- --   Liver Function Tests: No results found for this basename: AST:5,ALT:5,ALKPHOS:5,BILITOT:5,PROT:5,ALBUMIN:5 in the last 168 hours No results found for this basename: LIPASE:5,AMYLASE:5 in the last 168 hours No results found for this basename: AMMONIA:5 in the last 168 hours CBC:  Lab 02/07/11 0540 02/06/11 0500 02/05/11 0455 02/04/11 0500 02/03/11 1419  WBC 11.7* 12.2* 13.5* 12.1* 11.8*  NEUTROABS -- -- -- -- --  HGB 16.6 15.2 15.3 16.4 17.9*  HCT 47.6 43.4 44.6 46.6 50.8  MCV 88.6 88.2 87.6 88.1 87.6  PLT 269 236 240 217 242   Cardiac Enzymes:  Lab 02/04/11 1703 02/04/11 0916 02/04/11 0001  CKTOTAL 90 100 120  CKMB 2.8 3.4 3.9  CKMBINDEX -- -- --  TROPONINI <0.30 <0.30 0.42*   BNP:  Lab 02/04/11 0045  POCBNP 723.3*   CBG: No results found for this basename: GLUCAP:5 in the last 168 hours Microbiology: No results found for this basename: cult   No results found for this basename: CULT:2,SDES:2 in the last 168 hours  Imaging: No results found.   ASSESSMENT:  1) Large bilateral PE       -No RV strain 2) DVT - acute Rt mid to distal femoral and popliteal vein and chronic Lt leg popliteal vein  3) Ongoing tobacco  use  PLAN/DISCUSSION:  Remains hemodynamically stable.  Will continue with heparin and coumadin, goal INR >2.  Still trying to work out home INR meter with [employer].  Final LE doppler report shows acute DVT in rt leg, will review report to see how large the clot is with regards to temporary IVC filter.  The patient is not a fan of the filter but is willing to think about it as an option.  He would like to walk up stairs in order to see if his O2 Sats drop, will hold off on this until after dopplers reviewed.      LOS: 4 days    Robbi Garter, [place] 02/07/2011, 8:12 AM  Patient seen and examined with Ulyess Blossom PA-C. We discussed all aspects of the encounter. I agree with the assessment and plan as stated above. I have reviewed  vascular u/s personally with Dr. Myra Gianotti and it shows large residual clot in RLE from femoral vein into popliteal. Thus I think a IVC filter is reasonable consideration as given current clot burden in lung a secondary PE could be life-threatening. We discussed this at length and he agrees to proceed. Will have  Dr. Darrick Penna from vascular surgery to see him today for possible placement this afternoon. Keep NPO.  Total time spent 35 mins.   Wynston Romey BensimhonMD 11:32 AM

## 2011-02-07 NOTE — Op Note (Signed)
OPERATIVE REPORT  DATE OF SURGERY: 02/07/2011  PATIENT: Austin Garza, 60 y.o. male MRN: [id number]  DOB: 1950/09/04  PRE-OPERATIVE DIAGNOSIS: Pulm Embolus  POST-OPERATIVE DIAGNOSIS:  Same  PROCEDURE: Vena cavas filter, Adriana Simas  SURGEON:  Gretta Began, M.D.  PHYSICIAN ASSISTANT: none  ANESTHESIA:  local  EBL: minimal ml  Total I/O In: 662.5 [P.O.:520; I.V.:142.5] Out: -   BLOOD ADMINISTERED: none      COUNTS CORRECT:  YES  PLAN OF CARE: transfer to floor   PATIENT DISPOSITION:  PACU - hemodynamically stable  PROCEDURE DETAILS: The patient was taken to the cath lab and placed in the supine position. The right groin was prepped and draped in usual sterile fashion. Using local anesthetic and a singlewall puncture the right common femoral vein was entered. A guidewire was positioned up to the level of the mid vena cava. The Cook vena cava filter sheath was positioned over the guidewire at the level of L2. A 20 per 20 injection at this level revealed the level of the renal veins and revealed that the vena cava was less than 30 mm. The sheath dilator was removed and the filter was positioned through the sheath and deployed at the appropriate position. The sheath was removed and pressure was held for hemostasis. An x-ray was obtained for documentation. The patient was returned to his room in stable condition.   Gretta Began, M.D. 02/07/2011 3:24 PM

## 2011-02-07 NOTE — Interval H&P Note (Signed)
History and Physical Interval Note:  02/07/2011 2:43 PM  Austin Garza  has presented today for surgery, with the diagnosis of dvt  The various methods of treatment have been discussed with the patient and family. After consideration of risks, benefits and other options for treatment, the patient has consented to  Procedure(s): INSERTION OF VENA CAVA FILTER as a surgical intervention .  The patients' history has been reviewed, patient examined, no change in status, stable for surgery.  I have reviewed the patients' chart and labs.  Questions were answered to the patient's satisfaction.     Larina Earthly

## 2011-02-08 ENCOUNTER — Encounter (HOSPITAL_COMMUNITY): Payer: Self-pay | Admitting: Internal Medicine

## 2011-02-08 LAB — CBC
HCT: 46.8 % (ref 39.0–52.0)
Hemoglobin: 16.3 g/dL (ref 13.0–17.0)
MCHC: 34.8 g/dL (ref 30.0–36.0)
MCV: 87.6 fL (ref 78.0–100.0)
RDW: 12.8 % (ref 11.5–15.5)
WBC: 9.3 10*3/uL (ref 4.0–10.5)

## 2011-02-08 LAB — PROTIME-INR
INR: 1.79 — ABNORMAL HIGH (ref 0.00–1.49)
Prothrombin Time: 21.1 seconds — ABNORMAL HIGH (ref 11.6–15.2)

## 2011-02-08 MED ORDER — WARFARIN SODIUM 6 MG PO TABS: 6.0000 mg | ORAL_TABLET | Freq: Every day | ORAL | Status: AC

## 2011-02-08 MED ORDER — ENOXAPARIN SODIUM 80 MG/0.8ML ~~LOC~~ SOLN: 80.0000 mg | Freq: Two times a day (BID) | SUBCUTANEOUS | Status: DC

## 2011-02-08 MED ORDER — WARFARIN SODIUM 6 MG PO TABS
6.0000 mg | ORAL_TABLET | Freq: Once | ORAL | Status: DC
  Filled 2011-02-08: qty 1

## 2011-02-08 MED ORDER — HEPARIN (PORCINE) IN NACL 100-0.45 UNIT/ML-% IJ SOLN
2750.0000 [IU]/h | INTRAMUSCULAR | Status: DC
  Filled 2011-02-08 (×2): qty 250

## 2011-02-08 NOTE — Progress Notes (Signed)
Subjective: Feeling good after the surgery yesterday.  Denies any SOB, chest pain, nausea, vomiting, or abdominal pain.  He states that he feels like he is ready to go home.   Objective: Vital signs in last 24 hours: Filed Vitals:   02/08/11 1000 02/08/11 1100 02/08/11 1132 02/08/11 1200  BP:   140/77   Pulse:   67   Temp:   97.8 F (36.6 C)   TempSrc:   Oral   Resp:      Height:      Weight:      SpO2: 98% 98% 96% 98%   Weight change:   Intake/Output Summary (Last 24 hours) at 02/08/11 1403 Last data filed at 02/08/11 1200  Gross per 24 hour  Intake 2318.63 ml  Output      0 ml  Net 2318.63 ml   Physical Exam:  General: resting in bed Cardiac: RRR, no murmurs or gallops, or rubs Pulm: clear to auscultation bilaterally, moving normal volumes of air Abd: soft, nontender, nondistended, BS present Ext: warm and well perfused.  Neuro: alert and oriented X3, cranial nerves II-XII grossly intact, No focal neurologic deficits.   Lab Results: Basic Metabolic Panel:  Lab 02/07/11 1610 02/05/11 0455  NA 138 138  K 4.0 3.8  CL 101 104  CO2 28 25  GLUCOSE 112* 113*  BUN 10 12  CREATININE 1.10 1.13  CALCIUM 9.0 8.7  MG -- --  PHOS -- --   CBC:  Lab 02/08/11 0539 02/07/11 0540  WBC 9.3 11.7*  NEUTROABS -- --  HGB 16.3 16.6  HCT 46.8 47.6  MCV 87.6 88.6  PLT 289 269   Cardiac Enzymes:  Lab 02/04/11 1703 02/04/11 0916 02/04/11 0001  CKTOTAL 90 100 120  CKMB 2.8 3.4 3.9  CKMBINDEX -- -- --  TROPONINI <0.30 <0.30 0.42*   BNP:  Lab 02/04/11 0045  POCBNP 723.3*   D-Dimer:  Lab 2011/02/27 1419  DDIMER 5.10*   Coagulation:  Lab 02/08/11 0539 02/07/11 0540 02/06/11 0500 02/05/11 1307  LABPROT 21.1* 19.7* 16.4* 13.7  INR 1.79* 1.64* 1.30 1.03     Micro Results: Recent Results (from the past 240 hour(s))  MRSA PCR SCREENING     Status: Normal   Collection Time   02-27-11  8:54 PM      Component Value Range Status Comment   MRSA by PCR NEGATIVE   NEGATIVE  Final    Studies/Results: 2D TTE echo from 11/26 - Left ventricle: The cavity size was normal. Wall thickness was increased in a pattern of mild LVH. Systolic function was normal. The estimated ejection fraction was in the range of 60% to 65%. - Right ventricle: The cavity size was mildly dilated.  LE Dopplar Studies 11/26 - Findings consistent with acute deep vein thrombosis involving the right lower extremity. 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 - No obvious evidence of Baker's cyst on the right or left.  EKG from 11/25 shows S1 Q3 T3 consistent with pulmonary embolism. This is much more pronounced from  EKG on 27-Feb-2023 and lead 3 t wave inversion is new.  Medications: I have reviewed the patient's current medications. Scheduled Meds:    . docusate sodium  100 mg Oral BID  . heparin      . lidocaine      . warfarin  6 mg Oral ONCE-1800  . warfarin  6 mg Oral ONCE-1800  . warfarin   Does not apply  Once   Continuous Infusions:    . sodium chloride 10 mL/hr at 02/08/11 1200  . heparin 2,750 Units/hr (02/08/11 1200)  . DISCONTD: heparin 2,850 Units/hr (02/07/11 1600)  . DISCONTD: heparin 2,850 Units/hr (02/08/11 0800)   PRN Meds:.acetaminophen, acetaminophen, ondansetron (ZOFRAN) IV, ondansetron, oxyCODONE, polyethylene glycol Assessment/Plan: 1) Pulmonary embolism, bilateral -Needs lifelong antigoag, pursing warfarin as first choice. INR now 1.79. Got IVC filter placed on 11/28.  Thrombophilia panel negative for factor 5 Leiden.  Prothrombin mut pending. The rest of the panel is worthless as he was already started on heparin when it was drawn.  -- Will d/c on lovenox 80 mg SQ BID for 5 days.  -- will d/c today on 6 mg PO warfarin per pharm. Pt will check his INR. He states that Dr. Manson Passey from [facility] will be the doctor that he works with to manage his coumadin.   2) Bronchitis -Resolved.  Cough is likely irritation  2/2 PE rather than continued bronchitis. Moxifloxacin course was 8 days and has been afebrile since admision -- ibuprophen PRN  3) Tobacco abuse - Mr. Deckman absolutely needs to discontinue smoking.   4) DVT proph - Heparin and warfarin   LOS: 5 days   Leona Pressly 02/08/2011, 2:03 PM

## 2011-02-08 NOTE — Progress Notes (Addendum)
Subjective:   60 yo gentlemen admitted with bilateral PE with big clot burden, now on heparin/coumadin.  LE dopplers with acute DVT in Rt mid to distal femoral vein and popliteal vein.  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 S/p IVC filter by Dr. Arbie Cookey on 11/28  Echo: EF 60-65%, RV mildly dilated but normal systolic function.    No complaints of CP/SOB.  Leg pain resolved.    INR: 1.79  Intake/Output Summary (Last 24 hours) at 02/08/11 0736 Last data filed at 02/08/11 0700  Gross per 24 hour  Intake 1958.5 ml  Output    400 ml  Net 1558.5 ml    Current meds:    . docusate sodium  100 mg Oral BID  . heparin      . lidocaine      . warfarin  6 mg Oral ONCE-1800  . warfarin   Does not apply Once   Infusions:    . sodium chloride 10 mL/hr at 02/06/11 0700  . heparin 2,850 Units/hr (02/08/11 0046)  . DISCONTD: heparin 2,850 Units/hr (02/07/11 1600)     Objective:  Blood pressure 109/67, pulse 70, temperature 98.1 F (36.7 C), temperature source Oral, resp. rate 14, height 5' 6.5" (1.689 m), weight 86 kg (189 lb 9.5 oz), SpO2 96.00%. Weight change:   Physical Exam: General:  Well appearing. No resp difficulty HEENT: normal Neck: supple. JVP flat . Carotids 2+ bilat; no bruits. No lymphadenopathy or thryomegaly appreciated. Cor: PMI nondisplaced. Regular rate & rhythm. No rubs, gallops or murmurs. No RV lift Lungs: clear Abdomen: soft, nontender, nondistended. No hepatosplenomegaly. No bruits or masses. Good bowel sounds. Extremities: no cyanosis, clubbing, rash, edema.  Rt groin site without hematoma Neuro: alert & orientedx3, cranial nerves grossly intact. moves all 4 extremities w/o difficulty. Affect pleasant  Telemetry: Sinus rhythm 50-70s  Lab Results: Basic Metabolic Panel:  Lab 02/07/11 1610 02/05/11 0455 02/04/11 0500 02/03/11 1419  NA 138 138 139 137  K 4.0 3.8 -- --  CL 101 104 104 102  CO2 28 25 27 24   GLUCOSE 112* 113* 115* 113*  BUN 10 12 11 10    CREATININE 1.10 1.13 1.16 1.12  CALCIUM 9.0 8.7 8.7 9.4  MG -- -- -- --  PHOS -- -- -- --   Liver Function Tests: No results found for this basename: AST:5,ALT:5,ALKPHOS:5,BILITOT:5,PROT:5,ALBUMIN:5 in the last 168 hours No results found for this basename: LIPASE:5,AMYLASE:5 in the last 168 hours No results found for this basename: AMMONIA:5 in the last 168 hours CBC:  Lab 02/08/11 0539 02/07/11 0540 02/06/11 0500 02/05/11 0455 02/04/11 0500  WBC PENDING 11.7* 12.2* 13.5* 12.1*  NEUTROABS -- -- -- -- --  HGB 16.3 16.6 15.2 15.3 16.4  HCT 46.8 47.6 43.4 44.6 46.6  MCV 87.6 88.6 88.2 87.6 88.1  PLT 289 269 236 240 217   Cardiac Enzymes:  Lab 02/04/11 1703 02/04/11 0916 02/04/11 0001  CKTOTAL 90 100 120  CKMB 2.8 3.4 3.9  CKMBINDEX -- -- --  TROPONINI <0.30 <0.30 0.42*   BNP:  Lab 02/04/11 0045  POCBNP 723.3*   CBG: No results found for this basename: GLUCAP:5 in the last 168 hours Microbiology: No results found for this basename: cult   No results found for this basename: CULT:2,SDES:2 in the last 168 hours  Imaging: No results found.   ASSESSMENT:  1) Large bilateral PE       - s/p IVC filter on 11/28 by Dr. Arbie Cookey        -  No RV strain       - will need lifetime coumadin  2) DVT - acute Rt mid to distal femoral and popliteal vein and chronic Lt leg popliteal vein  3) Ongoing tobacco use  PLAN/DISCUSSION:  Doing well post IVC filter.  Ambulating without difficulty and remains hemodynamically stable.  Patient is anxious to go home today on lovenox and coumadin.  Has given himself lovenox injections in the past and is comfortable doing this again.  Lifetime coumadin is recommended.  He will need close INR follow ups.     LOS: 5 days    Robbi Garter, [place] 02/08/2011, 7:36 AM  Patient seen and examined with Ulyess Blossom PA-C. We discussed all aspects of the encounter. I agree with the assessment and plan as stated above. We will sign off. Please call with  questions. I am OK with him using using home INR monitor as long as he has a resource to discuss coumadin titration as needed, particularly in the early days of his therapy.   Trayce Maino BensimhonMD 8:47 AM

## 2011-02-08 NOTE — Discharge Summary (Signed)
Internal Medicine Teaching [facility] Discharge Note  Name: Austin Garza MRN: [id number] DOB: 22-Jul-1950 60 y.o.  Date of Admission: 02/03/2011  1:16 PM Date of Discharge: 02/08/2011 Attending Physician: Blanch Media  Discharge Diagnosis: 1. Pulmonary embolism, bilateral  - Provoked (8 hr car ride)  - Prior DVT 2 yrs ago, also provoked (long car ride) 2. Bronchitis 3. Tobacco abuse  Discharge Medications: Current Discharge Medication List    START taking these medications   Details  enoxaparin (LOVENOX) 80 MG/0.8ML SOLN injection Inject 0.8 mLs (80 mg total) into the skin every 12 (twelve) hours. Qty: 10 Syringe, Refills: 0    warfarin (COUMADIN) 6 MG tablet Take 1 tablet (6 mg total) by mouth daily. Qty: 30 tablet, Refills: 1      CONTINUE these medications which have NOT CHANGED   Details  aspirin 325 MG EC tablet Take 325 mg by mouth daily.        STOP taking these medications     moxifloxacin (AVELOX) 400 MG tablet        Disposition and follow-up:   Mr.Midas Atwood was discharged from [facility] in Stable and improved condition.  He will continue to check his INR and is working to get a home INR monitor.  He will work with Dr. Manson Passey from [facility] to manage his coumadin.    Follow-up Appointments:  Discharge Orders    Future Orders Please Complete By Expires   Diet - low sodium heart healthy      Increase activity slowly      Discharge instructions      Comments:   1. Start Lovenox 80 mg inject 1 syringe subcutaneously twice daily for 5 days. 2. Take your coumadin 6 mg tablets as directed. 3. Follow up INR in 5 days with your doctor. 4. Continue to follow up on getting your home INR meter with Dr. Alfonzo Beers office   Call MD for:  difficulty breathing, headache or visual disturbances      Call MD for:  extreme fatigue      Call MD for:      Comments:   Increased swelling in the legs or pain in the legs.      Consultations: Cardiology, Vascular surgery  Procedures Performed:  Dg Chest 2 View  02/03/2011  *RADIOLOGY REPORT*  Clinical Data: Chest pain and shortness of breath for 1 week. Bronchitis.  CHEST - 2 VIEW  Comparison: None.  Findings: Midline trachea.  Normal heart size and mediastinal contours. No pleural effusion or pneumothorax.  Mild reticular nodular/interstitial opacity.  Slightly upper lobe predominant.  IMPRESSION: 1.  No acute cardiopulmonary disease. 2. Slight upper lobe predominant reticular nodular opacity. Favored to represent the sequelae of chronic bronchitis/smoking. In the acute setting, atypical bacterial or viral pneumonia could look similar.  Original Report Authenticated By: Consuello Bossier, M.D.   Ct Angio Chest W/cm &/or Wo Cm  02/03/2011  IMPRESSION: Bilateral pulmonary emboli with moderate to large clot burden and evidence of right heart strain.  Fatty infiltration of the liver.  Critical Value/emergent results were called by telephone at the time of interpretation on 02/03/2011  at 5:30 p.m.  to  Renne Crigler, who verbally acknowledged these results.  Original Report Authenticated By: Rosendo Gros, M.D.   2D Echo: Mild LVH, normal systolic function, EF 60-65%, mildly dilated RV.  Lower extremity Doppler: - Findings consistent with acute deep vein thrombosis involving the right lower extremity. - Findings consistent with chronic deep vein  thrombosis involving the left lower extremity. - No obvious evidence of Baker's cyst on the right or left.  Admission HPI: The patient is a 60 yo man, history of 1 prior provoked DVT 2 years ago, presenting with chest pain and SOB. Four days ago, the patient completed an 8-hour drive, and subsequently developed significant SOB with exertion, which has mildly worsened since that time, and is now present after climbing 15 stairs. This morning, the patient also noted 2 episodes of substernal, pressure-like chest pain, which did not  radiate, which was associated with fatigue and shortness of breath, but was not associated with diaphoresis or nausea/vomiting, the first episode resolving with 30 minutes of rest, the second episode resolving with 5-10 minutes of rest. The patient took an aspirin, and presented to the ED. He notes no fevers, chills, leg swelling, or palpitations. At baseline, the patient has no chest pain, SOB, orthopnea, or PND. Of note, the patient was diagnosed 8 days ago with bronchitis, is on day 8/10 of Avelox, currently with only 2 episodes of non-productive cough today, with no nasal congestion, and no fevers for the last few days. The patient notes a normal stress test 12 years ago. The patient has no history of CAD, but does have a family history of CAD (father, age 37), and smokes 1-1.5 ppd.  The patient notes a prior left leg DVT 2 years ago, following a long drive, treated with 1 year of coumadin. 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 He currently notes no LLE edema during his current episode of SOB.  Hospital Course by problem list: 1. Pulmonary embolism, bilateral: Mr. Mustin presented with pleuritic substernal chest pain, SOB, and lower extremity swelling following a long drive.  CTA done in the ED showed bilateral PE with moderate to large clot burden.  He was admitted to the SDU and started on IV heparin per pharmacy consult.  He did require a large amount of heparin during his stay to maintained therapeutic heparin levels. He had a previous history of DVT and had finished a year of coumadin.  He had been off coumadin for 4-6 months.  He has no family history of clots and his hypercoagulation panel was inconclusive but he does not have factor V lieden or Prothrombin 16[address].  It was not drawn before Heparin was started.  Either way since this is his second episode of VTE he will be  on coumadin for life.  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  He was started on oral coumadin and on discharge his PTINR was 1.79.  Because he was not yet therapeutic he will be discharged with Lovenox 1 mg/kg BID for an additional 5 days as well as 6 mg daily of coumadin.  He will follow up with Dr. Manson Passey in [place] to adjust his coumadin dose to maintain a therapeutic INR.  2. Bilateral lower extremity DVT: On admission because of the large clot burden in his lungs, lower extremity dopplers were done which showed acute clot in his left thigh as well as chronic clot in the right leg.  We were concerned that with the acute clot and the already large clot burden in his lungs that if there were movement of the clot in the leg that Mr. Pereira would not survive any more clot burden.  We consulted  vascular surgery and the day prior to discharge an IVC filter was placed.  It was a model that is possible to remove but because of Mr. Scholer reluctance to establish care with a primary care doctor he will likely have it in place permanently.   3. Bronchitis:  On admission he had been on a course of Avelox that was prescribed by another doctor prior to admission for Bronchitis.  He was on day 8 of 10.  Given a normal WBC count as well as lack of fever and no finding of infection in CT of the chest it was stopped.  4. Tobacco abuse:  This is Mr. Giuliani primary modifiable risk factor.  He was counseled throughout admission on the need to quit smoking.  He declined Nicotine patch during admission and did not want any on discharge.  Discharge Vitals:  BP 140/77  Pulse 67  Temp(Src) 97.8 F (36.6 C) (Oral)  Resp 14  Ht 5' 6.5" (1.689 m)  Wt 189 lb 9.5 oz (86 kg)  BMI 30.14 kg/m2  SpO2 98%  Discharge Labs:  Results for orders placed during the hospital encounter of 02/03/11 (from the past 24 hour(s))  CBC     Status: Normal   Collection  Time   02/08/11  5:39 AM      Component Value Range   WBC 9.3  4.0 - 10.5 (K/uL)   RBC 5.34  4.22 - 5.81 (MIL/uL)   Hemoglobin 16.3  13.0 - 17.0 (g/dL)   HCT 62.9  52.8 - 41.3 (%)   MCV 87.6  78.0 - 100.0 (fL)   MCH 30.5  26.0 - 34.0 (pg)   MCHC 34.8  30.0 - 36.0 (g/dL)   RDW 24.4  01.0 - 27.2 (%)   Platelets 289  150 - 400 (K/uL)  HEPARIN LEVEL (UNFRACTIONATED)     Status: Abnormal   Collection Time   02/08/11  5:39 AM      Component Value Range   Heparin Unfractionated 0.87 (*) 0.30 - 0.70 (IU/mL)  PROTIME-INR     Status: Abnormal   Collection Time   02/08/11  5:39 AM      Component Value Range   Prothrombin Time 21.1 (*) 11.6 - 15.2 (seconds)   INR 1.79 (*) 0.00 - 1.49    Signed: PRIBULA,CHRISTOPHER 02/08/2011, 2:11 PM

## 2011-02-08 NOTE — Progress Notes (Signed)
ANTICOAGULATION CONSULT NOTE - Follow Up Consult  Pharmacy Consult for Heparin/coumadin Indication:  DVT/PE  Assessment: 60 yo male with PE/DVT on heparin/coumadin (day 6 overlap). Noted, LE dopplers with acute DVT in Rt mid to distal femoral vein and popliteal vein (chronic DVT in left leg). Heparin is at 2850 units/hr and is slightly above goal  (0.87). INR=1.79 and trending up since 11/26. Noted plans for lovenox at discharge.  Goal of Therapy:  Heparin level 0.3-0.7 units/ml INR=2-3  Plan:  -Decrease heparin rate to 2750 units/hr.  -Continue coumadin 6mg  po today (patient has done well on 6mg /day in the past) -Would send patient home on Lovenox 80mg  SQ Q12 (~1mg /kg Q12) hours for at least 5 days.  Kamarion Zagami, [employer].D. 8:50 AM   No Known Allergies  Patient Measurements: Height: 5' 6.5" (168.9 cm) Weight: 189 lb 9.5 oz (86 kg) IBW/kg (Calculated) : 64.95  Adjusted Body Weight: 83 kg  Vital Signs: Temp: 98.2 F (36.8 C) (11/29 0801) Temp src: Oral (11/29 0801) BP: 135/80 mmHg (11/29 0801) Pulse Rate: 68  (11/29 0801)  Labs:  Basename 02/08/11 0539 02/07/11 0540 02/06/11 2018 02/06/11 0500  HGB 16.3 16.6 -- --  HCT 46.8 47.6 -- 43.4  PLT 289 269 -- 236  APTT -- -- -- --  LABPROT 21.1* 19.7* -- 16.4*  INR 1.79* 1.64* -- 1.30  HEPARINUNFRC 0.87* 0.53 0.47 --  CREATININE -- 1.10 -- --  CKTOTAL -- -- -- --  CKMB -- -- -- --  TROPONINI -- -- -- --   Estimated Creatinine Clearance: 73.2 ml/min (by C-G formula based on Cr of 1.1).   Assessment: 60 yo male with PE/DVT on heparin/coumadin (day 6 overlap). Noted, LE dopplers with acute DVT in Rt mid to distal femoral vein and popliteal vein (chronic DVT in left leg). Heparin is at 2850 units/hr and is slightly above goal  (0.87). INR=1.79 and trend up since 11/26. Noted plans for lovenox.  Goal of Therapy:  Heparin level 0.3-0.7 units/ml INR=2-3  Plan:  -Decrease heparin rate to 2750 units/hr.  -Continue  coumadin 6mg  po today (patient has done well on 6mg /day in the past) -Would send patient home on Lovenox 80mg  SQ Q12 hours.   Benjaman Pott, Pharm D  02/08/2011 8:42 AM

## 2011-02-13 ENCOUNTER — Telehealth: Payer: Self-pay | Admitting: *Deleted

## 2011-02-13 NOTE — Telephone Encounter (Signed)
Pt called requesting a monitor for doing his INR's   He does not want to come in to clinic to get this done, he wants to do himself as he travels. I talked with Dr Tonny Branch and we are not pt's PCP.   Pt instructed to see his PCP or cardiologist for this matter. He voices understanding.

## 2011-08-02 ENCOUNTER — Telehealth: Payer: Self-pay | Admitting: *Deleted

## 2011-08-02 NOTE — Telephone Encounter (Signed)
Rec'd referral from Dr Curlene Labrum with [facility], patient has history of DVT/PE and needs further assessment of coag from Dr Cyndie Chime. Records from DVT in 2009 unobtainable at this time, they were done in [place]. Spoke with patient, he states he is only in town on fridays and Dr Cyndie Chime has [facility] on wednesdays and he will not be in town Monday-Thursday due to his job. Patient states it will be several weeks with all he has going on to establish an appt here with [place]. Called and spoke to [place] with Dr Connye Burkitt, informed her of the situation, she will ask Dr Connye Burkitt if patient can be seen by another Hematologist on fridays, per patient request. She will call us back with his decision so we can close this referral.

## 2011-08-03 ENCOUNTER — Telehealth: Payer: Self-pay | Admitting: *Deleted

## 2011-08-03 NOTE — Telephone Encounter (Signed)
Rec'd call back from [facility] with Dr Connye Burkitt that due to conflicting schedules of requested physician and patient work schedule and ability to only make Friday appointments, no special arrangements need to be made by physician, any of our Hematologist can see this patient.  Will process chart and send orders to scheduling. They will call and confirm appt with patient.

## 2011-08-07 ENCOUNTER — Telehealth: Payer: Self-pay | Admitting: *Deleted

## 2011-08-07 NOTE — Telephone Encounter (Signed)
spoke with patient he will have to look at his vacation time will call me back when he finds out his schedule

## 2011-08-13 ENCOUNTER — Telehealth: Payer: Self-pay | Admitting: *Deleted

## 2011-08-13 NOTE — Telephone Encounter (Signed)
I have left several message for this patient I did finally speak with him and he told me he would call me back when he checks his schedule I recieved the new patient chart on 08-03-2011 well pass the three day mark faxed over letter to the referral office

## 2011-11-09 ENCOUNTER — Telehealth: Payer: Self-pay | Admitting: *Deleted

## 2011-11-09 NOTE — Telephone Encounter (Signed)
patient called in and wanted to go ahead and set up appointment for new patient apppointment I spoke with Tiffany and informed her that thepatient is ready to be scheduled for the patient gave her the patient information

## 2013-02-20 ENCOUNTER — Telehealth: Payer: Self-pay | Admitting: *Deleted

## 2013-02-20 NOTE — Telephone Encounter (Signed)
Rocky Link called today to say that 5 years ago he had a DVT in his left leg; 2 years ago he had a DVT in his Right leg that led to PE and Dr Early put in an IVC filter. 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 He is having trouble sitting in truck for greater than 2-3 hours without his leg swelling. He is wearing 20-30mg  compression hose. He has been to [facility] that put him in heavier compression hose and wanted to do laser ablation. He did not like that advice. He then went to [facility] and they decreased the compression hose. He tried to go back to Dr Gala Romney but he is specializing now and can't see him. He is complaining of legs swelling, feelimg heavy, deep pain in them from above knee all the way down leg. When he lies down and elevates at night; they itch and have petechia as swelling decreases. He said he does not feel he has restless leg syndrome. He would like me to talk with Dr Early for advice of what to do, where to go and how to handle this so he can continue to work for 2 more years. I told him I would discuss with Dr Arbie Cookey on Tuesday and call him.

## 2013-02-24 ENCOUNTER — Telehealth: Payer: Self-pay | Admitting: *Deleted

## 2013-02-24 NOTE — Telephone Encounter (Signed)
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 He complains of legs feeling heavy,swelling and having deep pain. He is wearing 20-30mg  comp hose and elevates at night. However as the swelling goes down his legs begin to itch and are covered with petechiae. He wants to continue to work until 6 but his legs hurt so bad he can't sit still. I talked to Dr Dr Arbie Cookey. 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 He said he would be glad to see him whenever he wanted to make an appointment. I talked to Austin Garza & he said his schedule was full but he would like to talk to Dr Arbie Cookey.

## 2013-02-27 ENCOUNTER — Emergency Department (HOSPITAL_COMMUNITY): Payer: BC Managed Care – HMO

## 2013-02-27 ENCOUNTER — Encounter (HOSPITAL_COMMUNITY): Payer: Self-pay | Admitting: Emergency Medicine

## 2013-02-27 ENCOUNTER — Emergency Department (HOSPITAL_COMMUNITY)
Admission: EM | Admit: 2013-02-27 | Discharge: 2013-02-27 | Disposition: A | Discharge status: Home / Self Care | Payer: BC Managed Care – HMO | Attending: Emergency Medicine | Admitting: Emergency Medicine

## 2013-02-27 DIAGNOSIS — J159 Unspecified bacterial pneumonia: Secondary | ICD-10-CM | POA: Insufficient documentation

## 2013-02-27 DIAGNOSIS — Z86718 Personal history of other venous thrombosis and embolism: Secondary | ICD-10-CM | POA: Insufficient documentation

## 2013-02-27 DIAGNOSIS — Z86711 Personal history of pulmonary embolism: Secondary | ICD-10-CM | POA: Insufficient documentation

## 2013-02-27 DIAGNOSIS — M79609 Pain in unspecified limb: Secondary | ICD-10-CM | POA: Insufficient documentation

## 2013-02-27 DIAGNOSIS — J189 Pneumonia, unspecified organism: Secondary | ICD-10-CM

## 2013-02-27 DIAGNOSIS — F17200 Nicotine dependence, unspecified, uncomplicated: Secondary | ICD-10-CM | POA: Insufficient documentation

## 2013-02-27 LAB — COMPREHENSIVE METABOLIC PANEL
Alkaline Phosphatase: 74 U/L (ref 39–117)
BUN: 11 mg/dL (ref 6–23)
CO2: 25 mEq/L (ref 19–32)
Chloride: 98 mEq/L (ref 96–112)
Creatinine, Ser: 1.26 mg/dL (ref 0.50–1.35)
GFR calc Af Amer: 68 mL/min — ABNORMAL LOW (ref >90)
GFR calc non Af Amer: 59 mL/min — ABNORMAL LOW (ref >90)
Glucose, Bld: 115 mg/dL — ABNORMAL HIGH (ref 70–99)
Potassium: 3.8 mEq/L (ref 3.5–5.1)
Total Bilirubin: 0.4 mg/dL (ref 0.3–1.2)

## 2013-02-27 LAB — CBC WITH DIFFERENTIAL/PLATELET
HCT: 43.4 % (ref 39.0–52.0)
Hemoglobin: 15.1 g/dL (ref 13.0–17.0)
Lymphs Abs: 0.8 10*3/uL (ref 0.7–4.0)
Monocytes Absolute: 0.9 10*3/uL (ref 0.1–1.0)
Monocytes Relative: 11 % (ref 3–12)
Neutro Abs: 6.7 10*3/uL (ref 1.7–7.7)
Neutrophils Relative %: 79 % — ABNORMAL HIGH (ref 43–77)
RBC: 4.9 MIL/uL (ref 4.22–5.81)

## 2013-02-27 LAB — PROTIME-INR
INR: 1.89 — ABNORMAL HIGH (ref 0.00–1.49)
Prothrombin Time: 21.1 s — ABNORMAL HIGH (ref 11.6–15.2)

## 2013-02-27 LAB — D-DIMER, QUANTITATIVE: D-Dimer, Quant: <0.27 ug/mL-FEU (ref 0.00–0.48)

## 2013-02-27 MED ORDER — IOHEXOL 350 MG/ML SOLN
100.0000 mL | Freq: Once | INTRAVENOUS | Status: AC | PRN
  Administered 2013-02-27: 100 mL via INTRAVENOUS

## 2013-02-27 MED ORDER — SODIUM CHLORIDE 0.9 % IV BOLUS (SEPSIS)
2000.0000 mL | Freq: Once | INTRAVENOUS | Status: AC
  Administered 2013-02-27: 2000 mL via INTRAVENOUS

## 2013-02-27 MED ORDER — DEXTROSE 5 % IV SOLN
1.0000 g | Freq: Once | INTRAVENOUS | Status: AC
  Administered 2013-02-27: 1 g via INTRAVENOUS
  Filled 2013-02-27: qty 10

## 2013-02-27 MED ORDER — DEXTROSE 5 % IV SOLN
500.0000 mg | Freq: Once | INTRAVENOUS | Status: AC
  Administered 2013-02-27: 500 mg via INTRAVENOUS

## 2013-02-27 MED ORDER — AZITHROMYCIN 250 MG PO TABS: ORAL_TABLET | ORAL | Status: AC

## 2013-02-27 MED ORDER — AMOXICILLIN 500 MG PO TABS: 1000.0000 mg | ORAL_TABLET | Freq: Two times a day (BID) | ORAL | Status: AC

## 2013-02-27 MED ORDER — SODIUM CHLORIDE 0.9 % IV BOLUS (SEPSIS)
1000.0000 mL | Freq: Once | INTRAVENOUS | Status: AC
  Administered 2013-02-27: 1000 mL via INTRAVENOUS

## 2013-02-27 NOTE — ED Notes (Signed)
Phlebotomy at bedside.

## 2013-02-27 NOTE — ED Notes (Signed)
Per Dr. Fonnie Jarvis - pt is okay to eat and drink. Pt given apple juice.

## 2013-02-27 NOTE — ED Notes (Signed)
Bednar, MD is aware of the pt's blood pressure. Saline bolus started.

## 2013-02-27 NOTE — Telephone Encounter (Signed)
Dr Arbie Cookey does not have a schedule on Thursday 01/15 due to vacation, so on 01/22 he will have 2 available spots that are held for laser follow ups. I think this may be an option to get Mr Mounsey in on a Thursday. I have left the information for Sonya to review and get back with me. I called Mr Brandy to let him know that I would be working on getting his appointment approved by [facility] and [place]. He is aware and completely comfortable with waiting until 01/22 if possible.  Annabelle Harman

## 2013-02-27 NOTE — ED Provider Notes (Signed)
CSN: [id number]     Arrival date & time 02/27/13  0210 History   First MD Initiated Contact with Patient 02/27/13 0308     Chief Complaint  Patient presents with  . Shortness of Breath  . 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   Past Medical History  Diagnosis Date  . DVT (deep venous thrombosis)   . Bronchitis   . 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   . 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 Long term current use of anticoagulant Nov 2012   History reviewed. No pertinent past surgical history. Family History  Problem Relation Age of Onset  . Coronary artery disease Father    History  Substance Use Topics  . Smoking status: Current Every Day Smoker -- 1.50 packs/day for 45 years    Types: Cigarettes  . Smokeless tobacco: Not on file  . Alcohol Use: No    Review of Systems 10 Systems reviewed and are negative for acute change except as noted in the HPI. Allergies  Review of patient's allergies indicates no known allergies.  Home Medications   Current Outpatient Rx  Name  Route  Sig  Dispense  Refill  . EXPIRED: warfarin (COUMADIN) 6 MG tablet   Oral   Take 1 tablet (6 mg total) by mouth daily.   30 tablet   1   . amoxicillin (AMOXIL) 500 MG tablet   Oral   Take 2 tablets (1,000 mg total) by mouth 2 (two) times daily.   28 tablet  0   . azithromycin (ZITHROMAX Z-PAK) 250 MG tablet      2 po day one, then 1 daily x 4 days   6 tablet   0    BP 116/61  Pulse 80  Temp(Src) 99.6 F (37.6 C) (Oral)  Resp 20  Wt 195 lb (88.451 kg)  SpO2 92% Physical Exam  Nursing note and vitals reviewed. Constitutional:  Awake, alert, nontoxic appearance.  HENT:  Head: Atraumatic.  Eyes: Right eye exhibits no discharge. Left eye exhibits no discharge.  Neck: Neck supple.  Cardiovascular: Normal rate and regular rhythm.   No murmur heard. Pulmonary/Chest: Effort normal. No respiratory distress. He has no wheezes. He has rales. He exhibits no tenderness.  Few crackles left base only; pulse ox normal room air 96%  Abdominal: Soft. There is no tenderness. There is no rebound.  Musculoskeletal: He exhibits no edema and no tenderness.  Baseline ROM, no obvious new focal weakness.  Neurological:  Mental status and motor strength appears baseline for patient and situation.  Skin: No rash noted.  Psychiatric: He has a normal mood and affect.    ED Course  Procedures (including critical care time) Nontoxic with normal speech and gait throughout ED course (did not present with SIRS criteria or clinical suspicion of sepsis) even with transient hypotension which spontaneously  resolved, lactate ordered after transient hypotension, refuses admit after initial lactate resulted, questionable CAP (vs scarring/atelectasis) left lung, Pt willing to stay in ED for IVF and check lactate clearance. Patient / Family / Caregiver informed of clinical course, understand medical decision-making process, and agree with plan. Labs Review Labs Reviewed  CBC WITH DIFFERENTIAL - Abnormal; Notable for the following:    Neutrophils Relative % 79 (*)    Lymphocytes Relative 9 (*)    All other components within normal limits  COMPREHENSIVE METABOLIC PANEL - Abnormal; Notable for the following:    Sodium 133 (*)    Glucose, Bld 115 (*)    Albumin 3.3 (*)     GFR calc non Af Amer 59 (*)    GFR calc Af Amer 68 (*)    All other components within normal limits  PROTIME-INR - Abnormal; Notable for the following:    Prothrombin Time 21.1 (*)    INR 1.89 (*)    All other components within normal limits  CG4 I-STAT (LACTIC ACID) - Abnormal; Notable for the following:    Lactic Acid, Venous 4.09 (*)    All other components within normal limits  D-DIMER, QUANTITATIVE  CG4 I-STAT (LACTIC ACID)   Imaging Review No results found.  EKG Interpretation    Date/Time:  Friday February 27 2013 02:31:23 EST Ventricular Rate:  83 PR Interval:  68 QRS Duration: 78 QT Interval:  356 QTC Calculation: 418 R Axis:   -13 Text Interpretation:  Sinus rhythm Short PR interval Artifact in lead(s) I II aVR aVL aVF V1 V2 ED PHYSICIAN INTERPRETATION AVAILABLE IN [facility] Confirmed by TEST, RECORD ([id number]) on 03/01/2013 11:21:22 AM            MDM   1. CAP (community acquired pneumonia)    Dispo pending.    Hurman Horn, MD 03/03/13 [phone]

## 2013-02-27 NOTE — ED Notes (Signed)
Ordered heart healthy tray for pt.   

## 2013-02-27 NOTE — ED Notes (Signed)
Pt sitting up on the edge of the bed, working on the computer.

## 2013-02-27 NOTE — ED Notes (Signed)
Food tray delivered

## 2013-02-27 NOTE — ED Notes (Signed)
Pt. reports SOB with bilateral lower leg pain - more on the right popliteal onset yesterday afternoon , pt. Stated history of PE and DVT . Denies chest pain . Pt. also stated fever with dry cough .

## 2013-02-27 NOTE — ED Notes (Signed)
Pt states that he has an extensive hx of DVT and PE. Pt reports that the symptoms he is having now feel similar to the symptoms with previous blood clots. Pt states he takes 6 mg Warfarin.

## 2013-03-19 ENCOUNTER — Ambulatory Visit: Payer: BC Managed Care – HMO | Admitting: Vascular Surgery

## 2013-03-24 ENCOUNTER — Other Ambulatory Visit: Payer: Self-pay | Admitting: *Deleted

## 2013-03-24 DIAGNOSIS — I83893 Varicose veins of bilateral lower extremities with other complications: Secondary | ICD-10-CM

## 2013-04-01 ENCOUNTER — Encounter: Payer: Self-pay | Admitting: Vascular Surgery

## 2013-04-02 ENCOUNTER — Other Ambulatory Visit: Payer: Self-pay | Admitting: Vascular Surgery

## 2013-04-02 ENCOUNTER — Ambulatory Visit (INDEPENDENT_AMBULATORY_CARE_PROVIDER_SITE_OTHER): Payer: BC Managed Care – HMO | Admitting: Vascular Surgery

## 2013-04-02 ENCOUNTER — Ambulatory Visit (HOSPITAL_COMMUNITY)
Admission: RE | Admit: 2013-04-02 | Discharge: 2013-04-02 | Disposition: A | Discharge status: Home / Self Care | Payer: BC Managed Care – HMO | Source: Ambulatory Visit | Attending: Vascular Surgery | Admitting: Vascular Surgery

## 2013-04-02 ENCOUNTER — Encounter: Payer: Self-pay | Admitting: Vascular Surgery

## 2013-04-02 VITALS — BP 156/88 | HR 69 | Resp 18 | Ht 67.0 in | Wt 190.0 lb

## 2013-04-02 DIAGNOSIS — I824Y9 Acute embolism and thrombosis of unspecified deep veins of unspecified proximal lower extremity: Secondary | ICD-10-CM | POA: Insufficient documentation

## 2013-04-02 DIAGNOSIS — M79609 Pain in unspecified limb: Secondary | ICD-10-CM | POA: Insufficient documentation

## 2013-04-02 DIAGNOSIS — I824Z9 Acute embolism and thrombosis of unspecified deep veins of unspecified distal lower extremity: Secondary | ICD-10-CM | POA: Insufficient documentation

## 2013-04-02 DIAGNOSIS — I83893 Varicose veins of bilateral lower extremities with other complications: Secondary | ICD-10-CM | POA: Insufficient documentation

## 2013-04-02 DIAGNOSIS — Z86718 Personal history of other venous thrombosis and embolism: Secondary | ICD-10-CM

## 2013-04-02 DIAGNOSIS — I82819 Embolism and thrombosis of superficial veins of unspecified lower extremities: Secondary | ICD-10-CM | POA: Insufficient documentation

## 2013-04-02 DIAGNOSIS — M7989 Other specified soft tissue disorders: Secondary | ICD-10-CM | POA: Insufficient documentation

## 2013-04-02 DIAGNOSIS — I82409 Acute embolism and thrombosis of unspecified deep veins of unspecified lower extremity: Secondary | ICD-10-CM

## 2013-04-02 NOTE — Progress Notes (Signed)
Patient name: Austin Garza MRN: [id number] DOB: [birth year] Sex: male   Referred by: Dr. Manson Passey  Reason for referral:  Chief Complaint  Patient presents with  . Varicose Veins    bilateral DVT lower extremities  Referred by Dr Darcella Gasman  [facility] in [place]    HISTORY OF PRESENT ILLNESS: Patient seen today regarding lower extremity discomfort. He has a complex past history regarding venous thromboembolism. He is known to me from vena cava filter placement in November of 2012. He presented with pulmonary embolus which was recurrent and a great deal of residual thrombus in his lower extremities. He continues to do well. He isn't on long-term anticoagulation with Coumadin. He reports that he saw a coagulation specialist in [facility] and had lupus anticoagulant. Did not have the specific results of these studies. He is compliant with knee-high compression garments. He has had no venous ulceration. His main concern is regarding increasing telangiectasia around his ankles and a discomfort with prolonged standing. He does not have any significant swelling and does not have any varicosities. He is also seen an outlying vein center and apparently had recommendation for ablation of his saphenous veins bilaterally. He has also been counseled against this at the other centers.  Past Medical History  Diagnosis Date  . DVT (deep venous thrombosis)   . Bronchitis   . 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   . 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 Long term current use of anticoagulant Nov 2012    History reviewed. No pertinent past surgical history.  History   Social History  . Marital Status: Married    Spouse Name: N/A    Number of Children: N/A  . Years of Education: N/A   Occupational History  . Not on file.   Social History Main Topics  . Smoking  status: Current Every Day Smoker -- 1.50 packs/day for 45 years    Types: Cigarettes  . Smokeless tobacco: Never Used  . Alcohol Use: Yes  . Drug Use: No  . Sexual Activity: Not on file   Other Topics Concern  . Not on file   Social History Narrative  . No narrative on file    Family History  Problem Relation Age of Onset  . Coronary artery disease Father     Allergies as of 04/02/2013  . (No Known Allergies)    Current Outpatient Prescriptions on File Prior to Visit  Medication Sig Dispense Refill  . amoxicillin (AMOXIL) 500 MG tablet Take 2 tablets (1,000 mg total) by mouth 2 (two) times daily.  28 tablet  0  . azithromycin (ZITHROMAX Z-PAK) 250 MG tablet 2 po day one, then 1 daily x 4 days  6 tablet  0  . warfarin (COUMADIN) 6 MG tablet Take 1 tablet (6 mg total) by mouth daily.  30 tablet  1   No current facility-administered medications on file prior to visit.     REVIEW OF SYSTEMS:  Positives indicated with an "X"  CARDIOVASCULAR:  [ ]  chest pain   [ ]  chest pressure   [ ]  palpitations   [ ]  orthopnea   [ ]  dyspnea on exertion   [ ]  claudication   [ ]  rest pain   [x ] DVT   [ ]  phlebitis PULMONARY:   [ ]  productive cough   [ ]   asthma   [ ]  wheezing NEUROLOGIC:   [ ]  weakness  [ ]  paresthesias  [ ]  aphasia  [ ]  amaurosis  [ ]  dizziness HEMATOLOGIC:   [ ]  bleeding problems   [x ] clotting disorders MUSCULOSKELETAL:  [ ]  joint pain   [ ]  joint swelling GASTROINTESTINAL: [ ]   blood in stool  [ ]   hematemesis GENITOURINARY:  [ ]   dysuria  [ ]   hematuria PSYCHIATRIC:  [ ]  history of major depression INTEGUMENTARY:  [ ]  rashes  [ ]  ulcers CONSTITUTIONAL:  [ ]  fever   [ ]  chills  PHYSICAL EXAMINATION:  General: The patient is a well-nourished male, in no acute distress. Vital signs are BP 156/88  Pulse 69  Resp 18  Ht 5\' 7"  (1.702 m)  Wt 190 lb (86.183 kg)  BMI 29.75 kg/m2 Pulmonary: There is a good air exchange   Musculoskeletal: There are no major  deformities.  There is no significant extremity pain. Neurologic: No focal weakness or paresthesias are detected, Skin: There are no ulcer or rashes noted. Psychiatric: The patient has normal affect. Cardiovascular: 2+ dorsalis pedis pulses bilaterally. He does have some telangiectasia around his ankles but no varicosities and no ulceration or skin changes   [facility] Vascular Lab Studies:  Ordered and Independently Reviewed . 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 No significant superficial venous reflux. Some dilatation of his left great saphenous vein.  Impression and Plan:  Symptoms of chronic venous hypertension do to his prior DVT in his prior venous hypertension. I discussed this at length the patient and certainly recommend against ablation of his great saphenous vein. He does not have any significant incompetence in this could only make his venous hypertension worse. I spent there is no treatment option for his deep venous incompetence. I would recommend continued anticoagulation. I did reassure him that his vena cava filter should protect him against pulmonary embolus. The patient has difficulty with his job requires a great deal driving in standing. I explained that he is not causing any harm with his discomfort that [facility]. He understands this will see us again on an as-needed basis    Rodger Giangregorio [facility] [place] Office: [phone]

## 2013-05-08 IMAGING — CR DG CHEST 2V
2 series · 2 of 2 positions shown · non-contrast
Comparison: None.

CLINICAL DATA: Chest pain and shortness of breath for 1 week.
Bronchitis.

CHEST - 2 VIEW

[w chest pa]
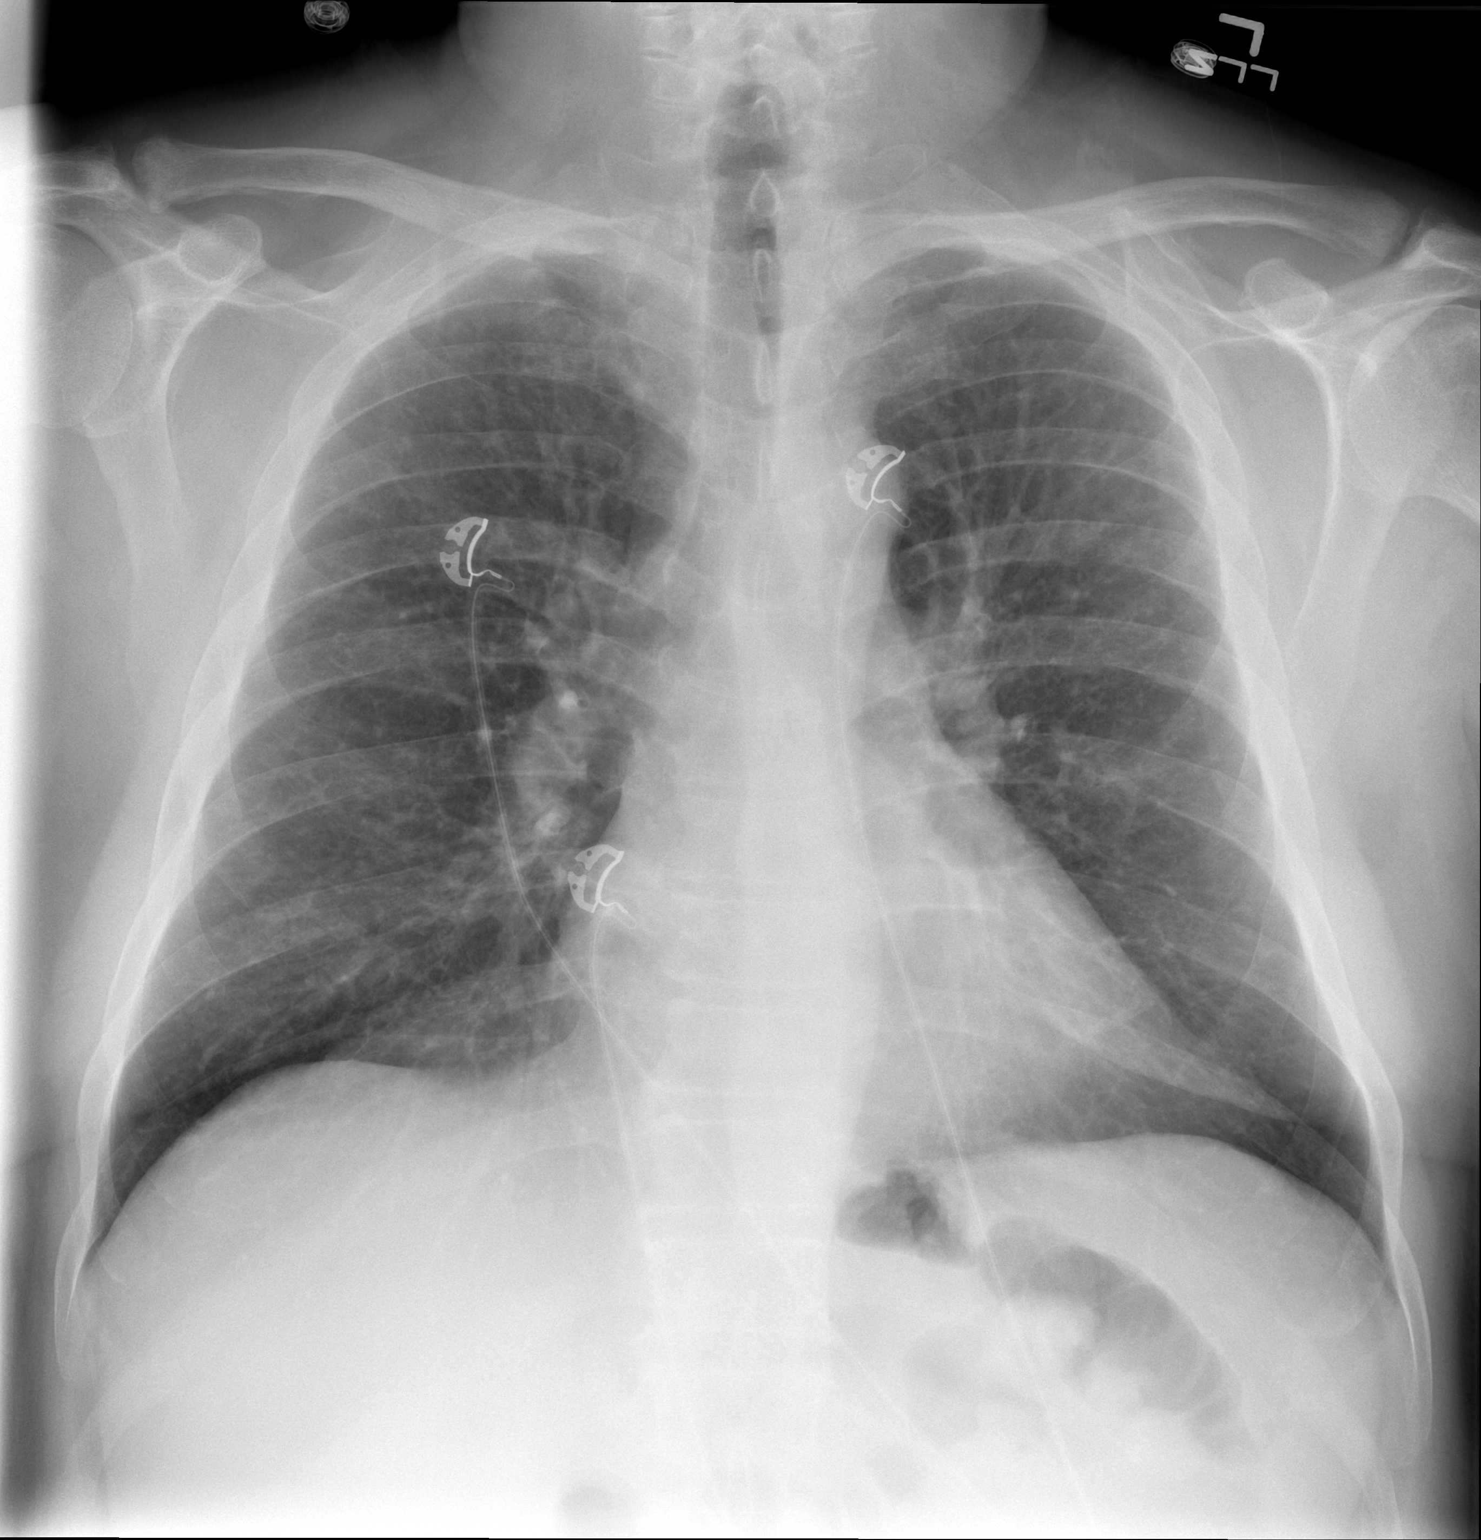

[w chest lat]
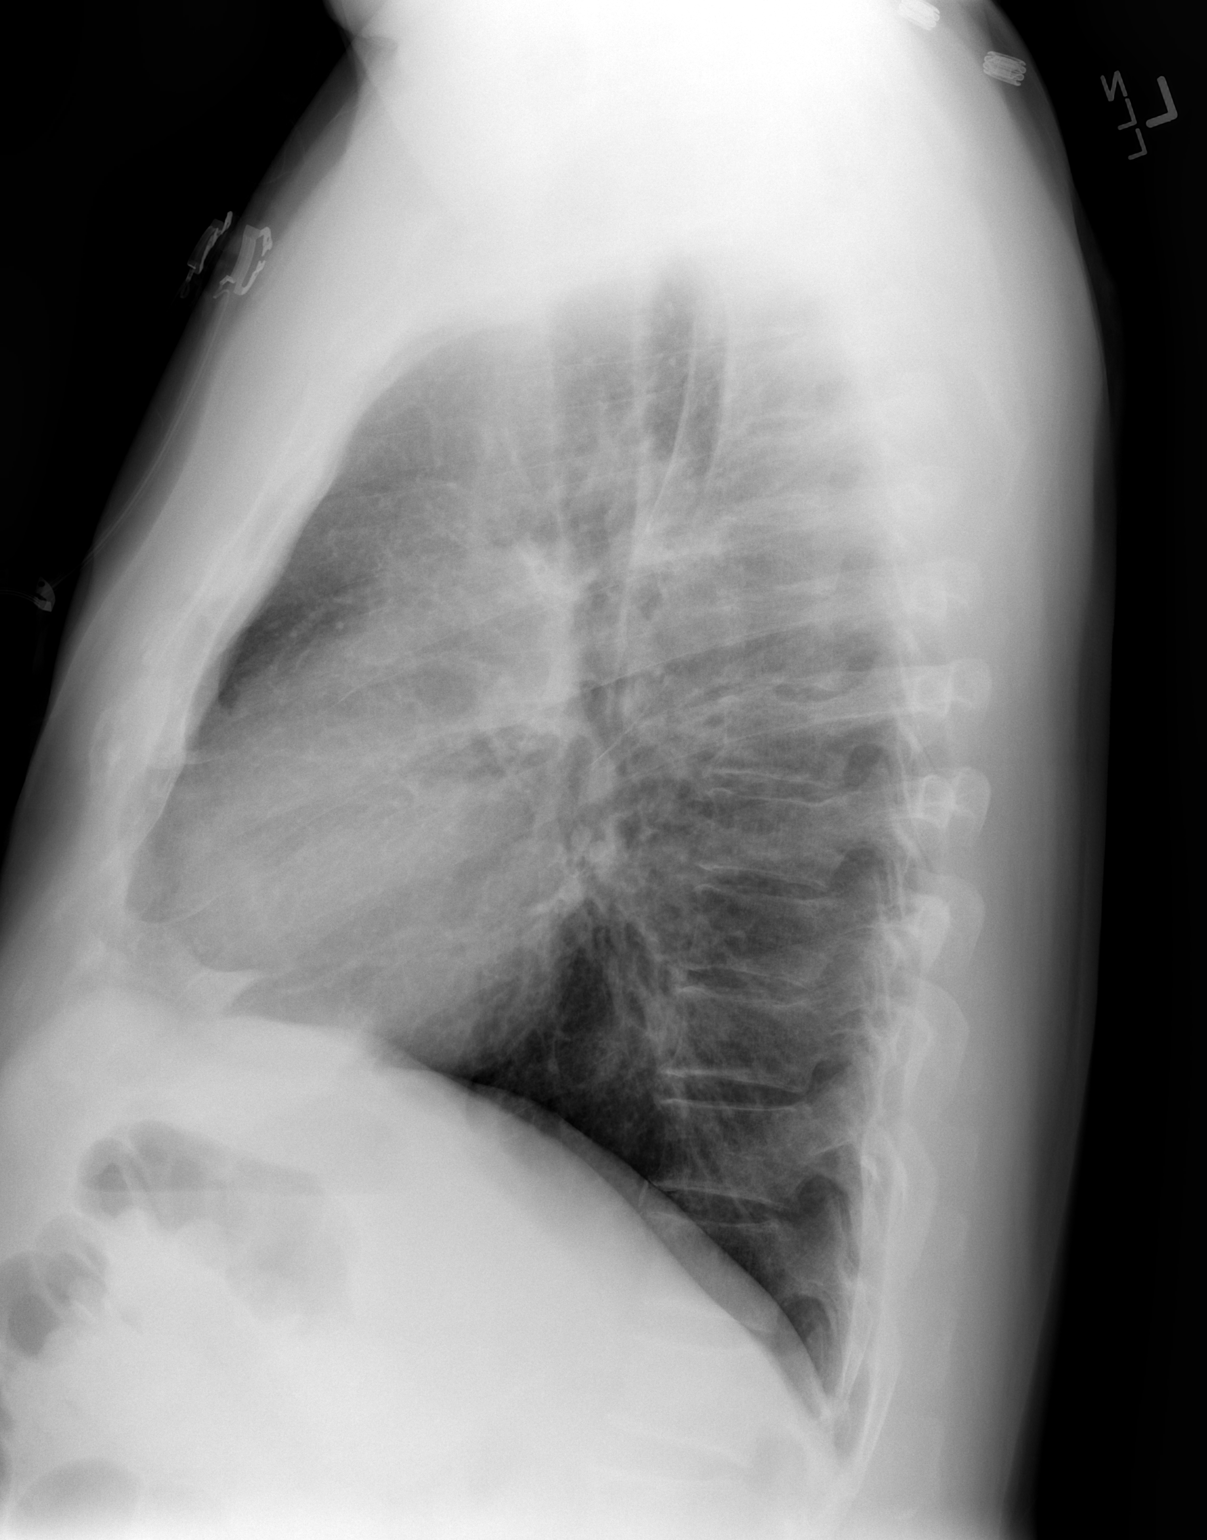

[2 of 2 positions shown; findings below may reference images not displayed]

FINDINGS: Midline trachea.  Normal heart size and mediastinal
contours.
No pleural effusion or pneumothorax.  Mild reticular
nodular/interstitial opacity.  Slightly upper lobe predominant.
IMPRESSION: 1.  No acute cardiopulmonary disease.
2. Slight upper lobe predominant reticular nodular opacity.
Favored to represent the sequelae of chronic bronchitis/smoking.
In the acute setting, atypical bacterial or viral pneumonia could
look similar.

## 2013-05-08 IMAGING — CT CT ANGIO CHEST
2 of 6 series · 19 of 46 positions shown · IV contrast (APPLIED)
Comparison: 02/03/2011 chest radiograph

CLINICAL DATA: 60-year-old male with shortness of breath, chest
pain and elevated D-dimer.

CT ANGIOGRAPHY CHEST WITH CONTRAST
TECHNIQUE: Multidetector CT imaging of the chest was performed
using the standard protocol during bolus administration of
intravenous contrast.  Multiplanar CT image reconstructions
including MIPs were obtained to evaluate the vascular anatomy.
Contrast: 100mL OMNIPAQUE IOHEXOL 350 MG/ML IV SOLN

[Series 6: pulm embolism 1.0 b25f thin · axial · 0.79mm/px · z∈[-301,-20]mm · 16 of 309 slices shown]
[im 14/309  lung]
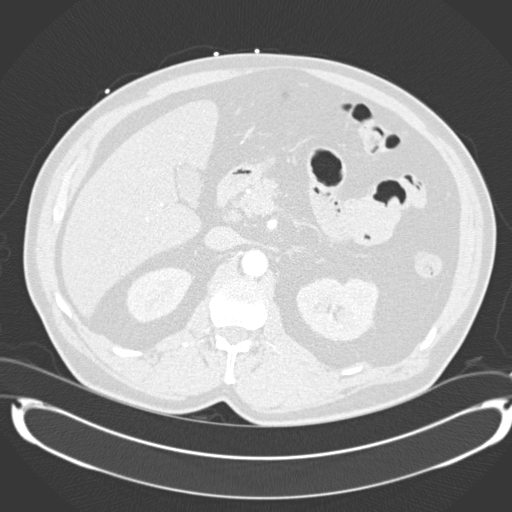
[im 41/309  soft-tissue]
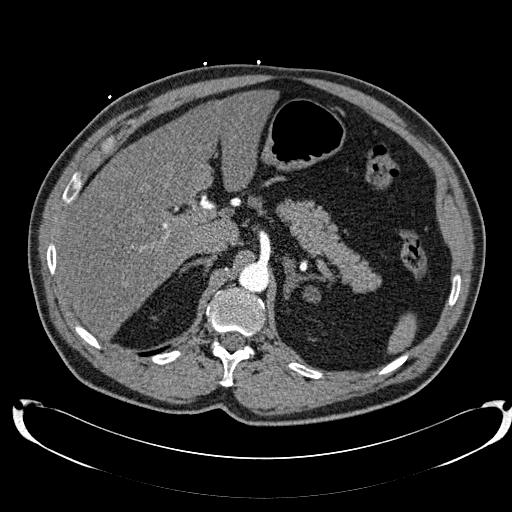
[im 54/309  lung]
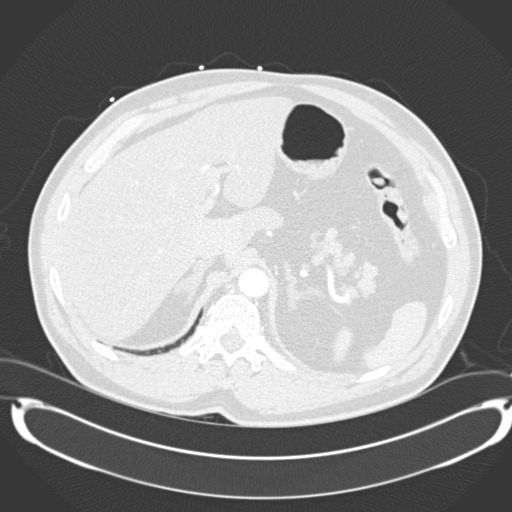
[im 67/309  soft-tissue]
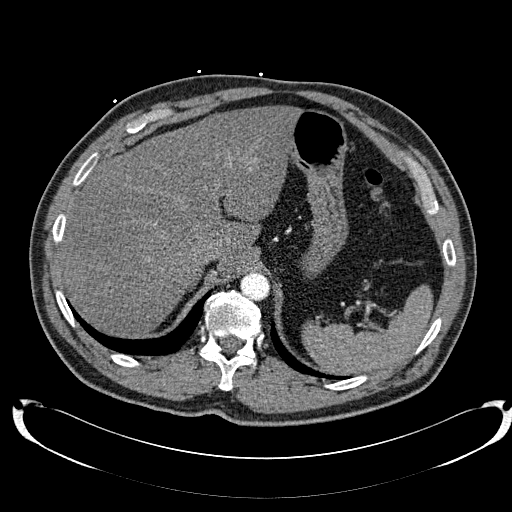
[im 94/309  lung]
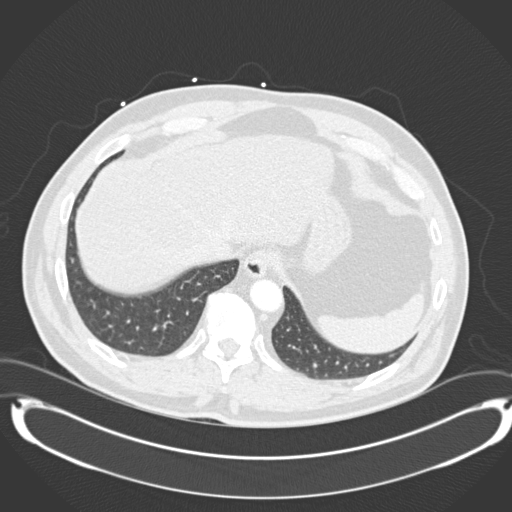
[im 108/309  soft-tissue]
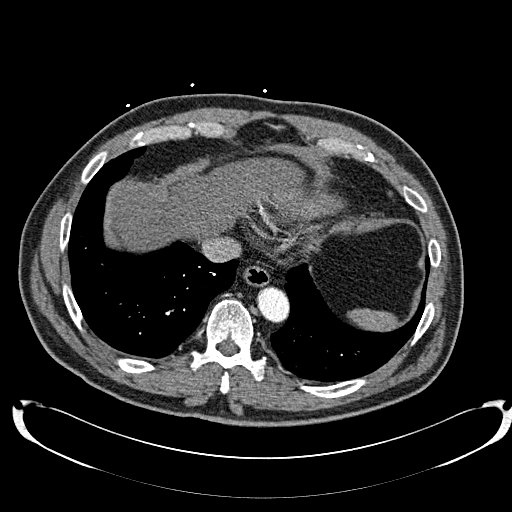
[im 121/309  lung]
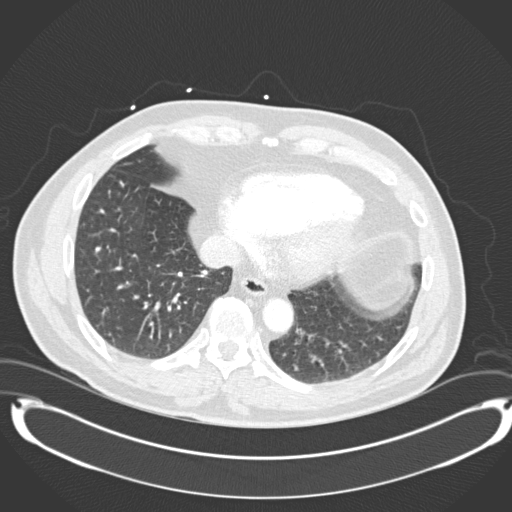
[im 148/309  soft-tissue]
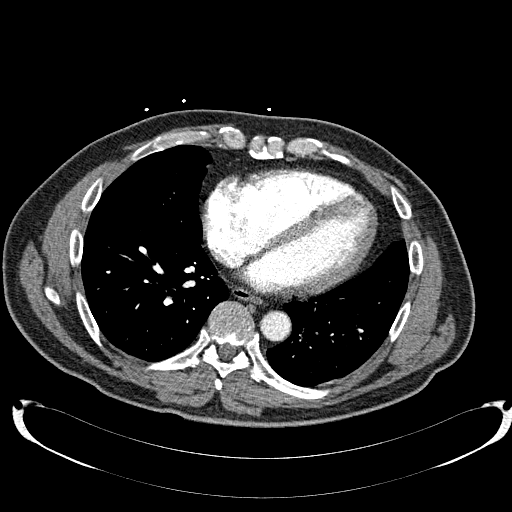
[im 161/309  lung]
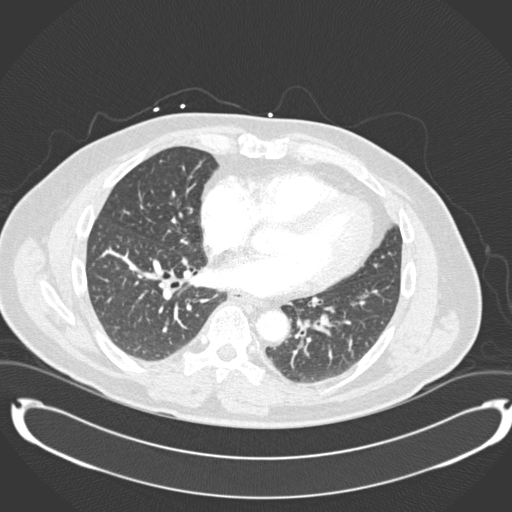
[im 188/309  soft-tissue]
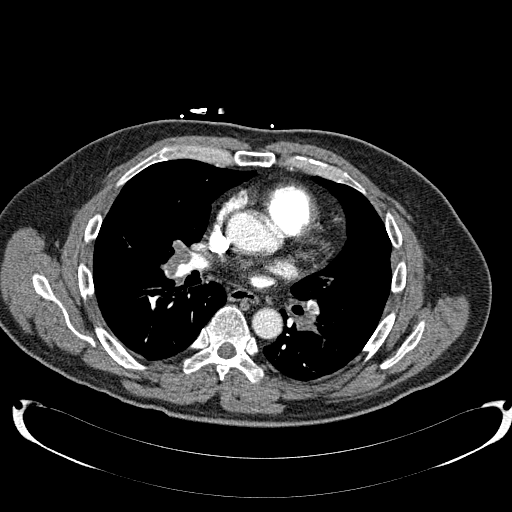
[im 201/309  lung]
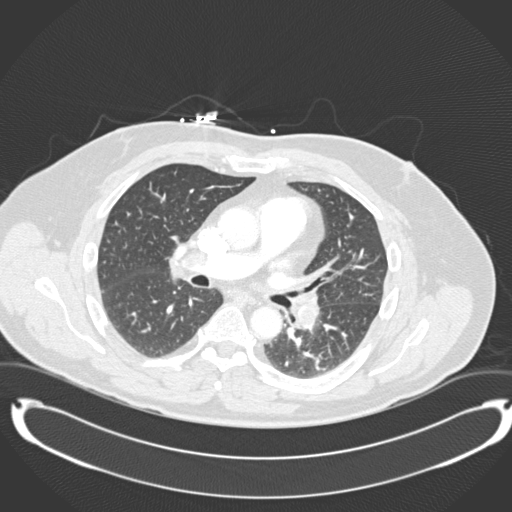
[im 215/309  soft-tissue]
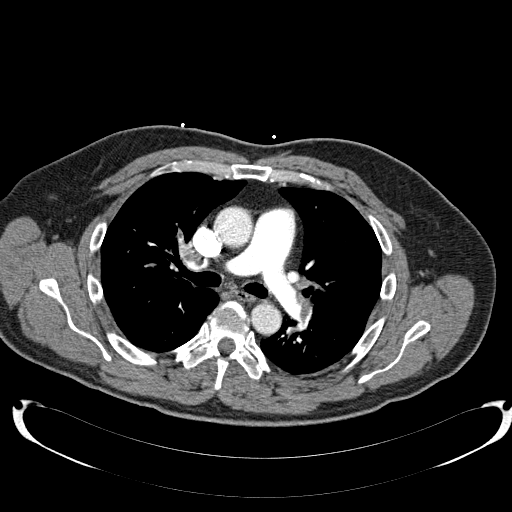
[im 242/309  lung]
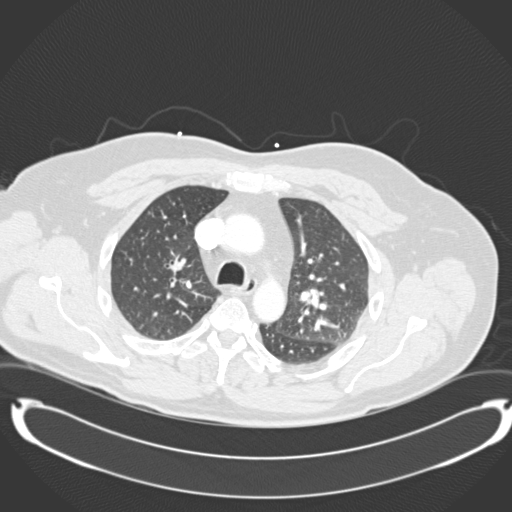
[im 255/309  soft-tissue]
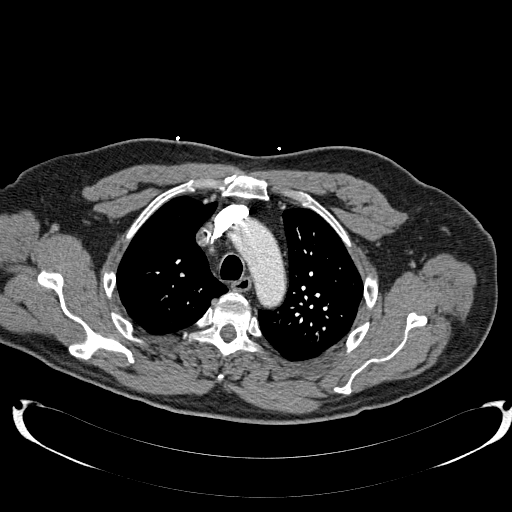
[im 268/309  lung]
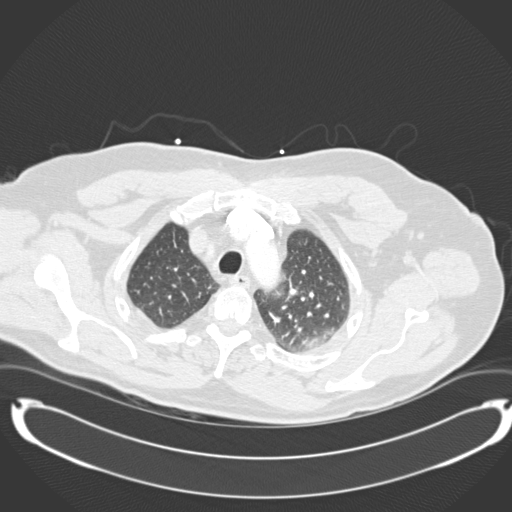
[im 295/309  soft-tissue]
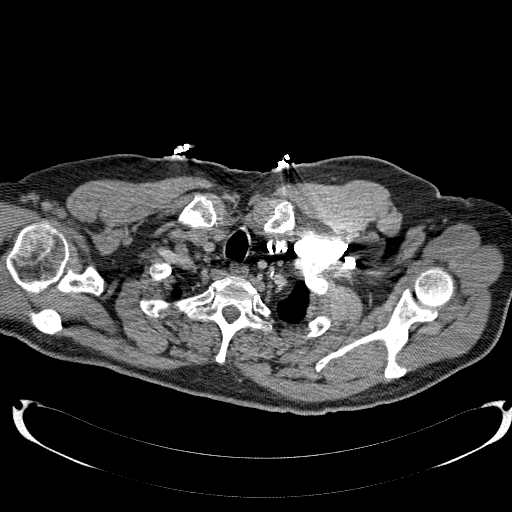

[Series 602: cor · coronal · 0.79mm/px · 3 of 131 slices shown]
[im 33/131  soft-tissue]
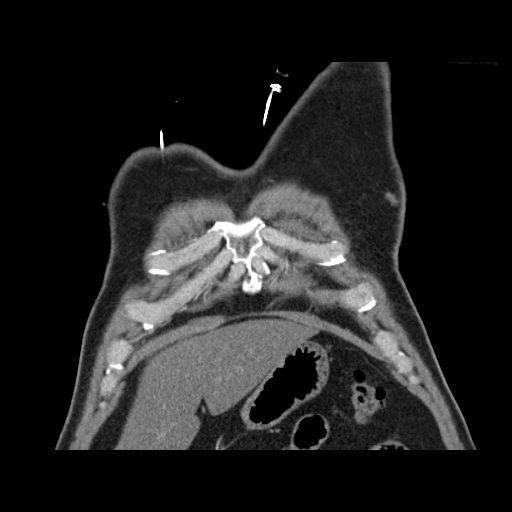
[im 66/131  soft-tissue]
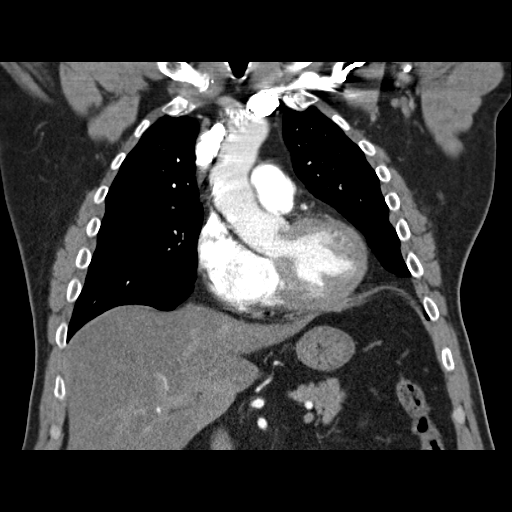
[im 98/131  soft-tissue]
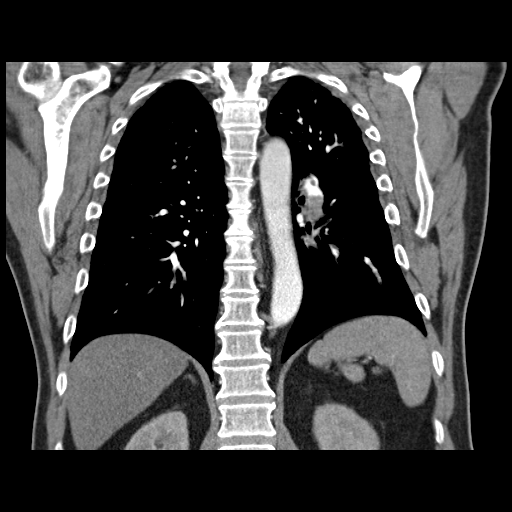

[19 of 46 positions shown; findings below may reference images not displayed]

FINDINGS: This is a technically satisfactory study.

Pulmonary emboli are identified within the distal main pulmonary
arteries bilaterally extending into the right middle lobe, lingula,
bilateral lower lobes and right upper lobe.   A moderate to large
clot burden is identified with evidence of right heart strain.

There are no pleural or pericardial effusions present.
No enlarged lymph nodes are identified.

The lungs are clear.
There is no evidence of airspace disease, consolidation, infarct,
mass or endobronchial/endotracheal lesions.

No acute or suspicious bony abnormalities are identified.
Fatty infiltration of the visualized liver is identified.

Review of the MIP images confirms the above findings.
IMPRESSION: Bilateral pulmonary emboli with moderate to large clot burden and
evidence of right heart strain.

Fatty infiltration of the liver.

Critical Value/emergent results were called by telephone at the
time of interpretation on 02/03/2011  at [DATE] p.m.  to  Tsugunori
Jim, who verbally acknowledged these results.

## 2014-02-17 ENCOUNTER — Encounter (HOSPITAL_COMMUNITY): Payer: Self-pay | Admitting: Vascular Surgery
